# Patient Record
Sex: Male | Born: 1955 | Race: White | Hispanic: No | Marital: Married | Smoking: Never smoker
Health system: Southern US, Community
[De-identification: ages and names within clinical notes are randomized; demographics above are authoritative.]

## PROBLEM LIST (undated history)

## (undated) DIAGNOSIS — E119 Type 2 diabetes mellitus without complications: Secondary | ICD-10-CM

## (undated) DIAGNOSIS — E785 Hyperlipidemia, unspecified: Secondary | ICD-10-CM

## (undated) DIAGNOSIS — I10 Essential (primary) hypertension: Secondary | ICD-10-CM

## (undated) DIAGNOSIS — R55 Syncope and collapse: Secondary | ICD-10-CM

## (undated) HISTORY — DX: Syncope and collapse: R55

---

## 2011-10-05 ENCOUNTER — Ambulatory Visit: Payer: Self-pay

## 2011-10-05 DIAGNOSIS — E119 Type 2 diabetes mellitus without complications: Secondary | ICD-10-CM

## 2011-10-05 DIAGNOSIS — E7800 Pure hypercholesterolemia, unspecified: Secondary | ICD-10-CM

## 2011-10-05 DIAGNOSIS — I10 Essential (primary) hypertension: Secondary | ICD-10-CM

## 2012-05-18 ENCOUNTER — Ambulatory Visit: Payer: Self-pay | Admitting: Internal Medicine

## 2012-05-18 VITALS — BP 122/94 | HR 52 | Temp 98.2°F | Resp 16 | Ht 71.25 in | Wt 185.0 lb

## 2012-05-18 DIAGNOSIS — R7302 Impaired glucose tolerance (oral): Secondary | ICD-10-CM | POA: Insufficient documentation

## 2012-05-18 DIAGNOSIS — I10 Essential (primary) hypertension: Secondary | ICD-10-CM | POA: Insufficient documentation

## 2012-05-18 DIAGNOSIS — R7309 Other abnormal glucose: Secondary | ICD-10-CM

## 2012-05-18 DIAGNOSIS — E119 Type 2 diabetes mellitus without complications: Secondary | ICD-10-CM | POA: Insufficient documentation

## 2012-05-18 DIAGNOSIS — E785 Hyperlipidemia, unspecified: Secondary | ICD-10-CM

## 2012-05-18 DIAGNOSIS — E1159 Type 2 diabetes mellitus with other circulatory complications: Secondary | ICD-10-CM | POA: Insufficient documentation

## 2012-05-18 DIAGNOSIS — Z139 Encounter for screening, unspecified: Secondary | ICD-10-CM

## 2012-05-18 LAB — POCT CBC
Granulocyte percent: 64.9 %G (ref 37–80)
MID (cbc): 0.5 (ref 0–0.9)
MPV: 8.8 fL (ref 0–99.8)
POC Granulocyte: 6 (ref 2–6.9)
POC LYMPH PERCENT: 30 %L (ref 10–50)
POC MID %: 5.1 %M (ref 0–12)
Platelet Count, POC: 317 10*3/uL (ref 142–424)
RDW, POC: 13.2 %

## 2012-05-18 LAB — COMPREHENSIVE METABOLIC PANEL
ALT: 18 U/L (ref 0–53)
AST: 19 U/L (ref 0–37)
CO2: 31 mEq/L (ref 19–32)
Creat: 0.96 mg/dL (ref 0.50–1.35)
Total Bilirubin: 0.7 mg/dL (ref 0.3–1.2)

## 2012-05-18 LAB — TSH: TSH: 1.231 u[IU]/mL (ref 0.350–4.500)

## 2012-05-18 LAB — LIPID PANEL
HDL: 53 mg/dL (ref >39)
Total CHOL/HDL Ratio: 4.7 Ratio

## 2012-05-18 LAB — PSA: PSA: 1.3 ng/mL (ref <=4.00)

## 2012-05-18 LAB — GLUCOSE, POCT (MANUAL RESULT ENTRY): POC Glucose: 99 mg/dl (ref 70–99)

## 2012-05-18 LAB — POCT GLYCOSYLATED HEMOGLOBIN (HGB A1C): Hemoglobin A1C: 6.6

## 2012-05-18 MED ORDER — AMLODIPINE BESYLATE 10 MG PO TABS: 10.0000 mg | ORAL_TABLET | Freq: Every day | ORAL | Status: DC

## 2012-05-18 MED ORDER — LISINOPRIL-HYDROCHLOROTHIAZIDE 20-12.5 MG PO TABS: 1.0000 | ORAL_TABLET | Freq: Every day | ORAL | Status: DC

## 2012-05-18 NOTE — Progress Notes (Signed)
  Subjective:    Patient ID: Christopher Nolan, male    DOB: 10-Sep-1956, 56 y.o.   MRN: [id number]  HPI Feels great. Wants meds rf. Last visit before epic. Paper chart review reveals he is on 3 bp meds. We called pharmacy and he was given only lisinopril/hct and amlodipine and not metoprolol. He also was dx with glucose intolerance and hyperlipidemia. He is on no tx for these issues   Review of Systems No cpe in years    Objective:   Physical Exam  Constitutional: He is oriented to person, place, and time. He appears well-developed and well-nourished.  HENT:  Mouth/Throat: Oropharynx is clear and moist.  Eyes: EOM are normal.  Neck: Neck supple. No thyromegaly present.  Cardiovascular: Normal rate, regular rhythm and normal heart sounds.   Pulmonary/Chest: Effort normal and breath sounds normal.  Genitourinary: Rectum normal, prostate normal and penis normal.  Neurological: He is alert and oriented to person, place, and time.  Skin: Skin is warm and dry.  Psychiatric: He has a normal mood and affect.   labs  Results for orders placed in visit on 05/18/12  POCT CBC      Component Value Range   WBC 9.3  4.6 - 10.2 K/uL   Lymph, poc 2.8  0.6 - 3.4   POC LYMPH PERCENT 30.0  10 - 50 %L   MID (cbc) 0.5  0 - 0.9   POC MID % 5.1  0 - 12 %M   POC Granulocyte 6.0  2 - 6.9   Granulocyte percent 64.9  37 - 80 %G   RBC 5.24  4.69 - 6.13 M/uL   Hemoglobin 15.4  14.1 - 18.1 g/dL   HCT, POC 02.7  25.3 - 53.7 %   MCV 91.9  80 - 97 fL   MCH, POC 29.4  27 - 31.2 pg   MCHC 32.0  31.8 - 35.4 g/dL   RDW, POC 66.4     Platelet Count, POC 317  142 - 424 K/uL   MPV 8.8  0 - 99.8 fL  GLUCOSE, POCT (MANUAL RESULT ENTRY)      Component Value Range   POC Glucose 99  70 - 99 mg/dl  POCT GLYCOSYLATED HEMOGLOBIN (HGB A1C)      Component Value Range   Hemoglobin A1C 6.6          Assessment & Plan:  RF lisin/hct and amlodipine

## 2012-05-18 NOTE — Patient Instructions (Signed)
Place hyperlipidemia patient instructions here.  

## 2014-03-03 ENCOUNTER — Ambulatory Visit: Payer: Self-pay | Attending: Internal Medicine

## 2016-03-11 ENCOUNTER — Ambulatory Visit (HOSPITAL_COMMUNITY): Admission: AD | Admit: 2016-03-11 | Payer: Self-pay | Admitting: Cardiology

## 2016-03-11 ENCOUNTER — Emergency Department (HOSPITAL_COMMUNITY): Payer: Self-pay

## 2016-03-11 ENCOUNTER — Encounter (HOSPITAL_COMMUNITY): Payer: Self-pay | Admitting: Physical Medicine and Rehabilitation

## 2016-03-11 ENCOUNTER — Emergency Department (HOSPITAL_COMMUNITY)
Admission: EM | Admit: 2016-03-11 | Discharge: 2016-03-11 | Disposition: A | Discharge status: Home / Self Care | Payer: Self-pay | Source: Other Acute Inpatient Hospital | Attending: Emergency Medicine | Admitting: Emergency Medicine

## 2016-03-11 ENCOUNTER — Encounter (HOSPITAL_COMMUNITY): Admission: AD | Payer: Self-pay

## 2016-03-11 DIAGNOSIS — R55 Syncope and collapse: Secondary | ICD-10-CM | POA: Insufficient documentation

## 2016-03-11 DIAGNOSIS — E119 Type 2 diabetes mellitus without complications: Secondary | ICD-10-CM | POA: Insufficient documentation

## 2016-03-11 DIAGNOSIS — I10 Essential (primary) hypertension: Secondary | ICD-10-CM | POA: Insufficient documentation

## 2016-03-11 DIAGNOSIS — R110 Nausea: Secondary | ICD-10-CM | POA: Insufficient documentation

## 2016-03-11 HISTORY — DX: Essential (primary) hypertension: I10

## 2016-03-11 LAB — URINALYSIS, ROUTINE W REFLEX MICROSCOPIC
Bilirubin Urine: NEGATIVE
GLUCOSE, UA: NEGATIVE mg/dL
Hgb urine dipstick: NEGATIVE
Ketones, ur: NEGATIVE mg/dL
LEUKOCYTES UA: NEGATIVE
Nitrite: NEGATIVE
Protein, ur: 100 mg/dL — AB
Specific Gravity, Urine: 1.025 (ref 1.005–1.030)
pH: 6.5 (ref 5.0–8.0)

## 2016-03-11 LAB — URINE MICROSCOPIC-ADD ON

## 2016-03-11 LAB — BASIC METABOLIC PANEL
ANION GAP: 10 (ref 5–15)
BUN: 13 mg/dL (ref 6–20)
CALCIUM: 9.1 mg/dL (ref 8.9–10.3)
CO2: 29 mmol/L (ref 22–32)
Chloride: 100 mmol/L — ABNORMAL LOW (ref 101–111)
Creatinine, Ser: 1.14 mg/dL (ref 0.61–1.24)
GFR calc Af Amer: >60 mL/min (ref >60)
GFR calc non Af Amer: >60 mL/min (ref >60)
GLUCOSE: 231 mg/dL — AB (ref 65–99)
POTASSIUM: 3.8 mmol/L (ref 3.5–5.1)
Sodium: 139 mmol/L (ref 135–145)

## 2016-03-11 LAB — CBC
HEMATOCRIT: 49.2 % (ref 39.0–52.0)
HEMOGLOBIN: 17 g/dL (ref 13.0–17.0)
MCH: 29.8 pg (ref 26.0–34.0)
MCHC: 34.6 g/dL (ref 30.0–36.0)
MCV: 86.3 fL (ref 78.0–100.0)
Platelets: 288 10*3/uL (ref 150–400)
RBC: 5.7 MIL/uL (ref 4.22–5.81)
RDW: 12.2 % (ref 11.5–15.5)
WBC: 15.8 10*3/uL — ABNORMAL HIGH (ref 4.0–10.5)

## 2016-03-11 LAB — I-STAT TROPONIN, ED: Troponin i, poc: 0 ng/mL (ref 0.00–0.08)

## 2016-03-11 SURGERY — LEFT HEART CATH AND CORONARY ANGIOGRAPHY

## 2016-03-11 MED ORDER — LISINOPRIL 20 MG PO TABS
20.0000 mg | ORAL_TABLET | Freq: Once | ORAL | Status: AC
  Administered 2016-03-11: 20 mg via ORAL
  Filled 2016-03-11: qty 1

## 2016-03-11 MED ORDER — HYDROCHLOROTHIAZIDE 12.5 MG PO CAPS
12.5000 mg | ORAL_CAPSULE | Freq: Every day | ORAL | Status: DC
  Administered 2016-03-11: 12.5 mg via ORAL
  Filled 2016-03-11: qty 1

## 2016-03-11 NOTE — ED Provider Notes (Signed)
CSN: [id number]     Arrival date & time 03/11/16  0901 History   First MD Initiated Contact with Patient 03/11/16 [phone]     Chief Complaint  Patient presents with  . Loss of Consciousness     (Consider location/radiation/quality/duration/timing/severity/associated sxs/prior Treatment) Patient is a 60 y.o. male presenting with syncope. The history is provided by the patient.  Loss of Consciousness Episode history:  Single Most recent episode:  Today Duration:  30 seconds Timing:  Constant Progression:  Unchanged Chronicity:  New Witnessed: yes   Relieved by:  Nothing Worsened by:  Nothing tried Ineffective treatments:  None tried Associated symptoms: nausea (preceding)   Associated symptoms: no fever, no palpitations, no shortness of breath and no vomiting   Associated symptoms comment:  Became lightheaded immediately before event   Past Medical History  Diagnosis Date  . Hypertension    History reviewed. No pertinent past surgical history. No family history on file. Social History  Substance Use Topics  . Smoking status: Never Smoker   . Smokeless tobacco: Current User    Types: Chew  . Alcohol Use: No    Review of Systems  Constitutional: Negative for fever.  Respiratory: Negative for shortness of breath.   Cardiovascular: Positive for syncope. Negative for palpitations.  Gastrointestinal: Positive for nausea (preceding). Negative for vomiting.  All other systems reviewed and are negative.     Allergies  Review of patient's allergies indicates no known allergies.  Home Medications   Prior to Admission medications   Not on File   BP 145/110 mmHg  Pulse 91  Temp(Src) 97.8 F (36.6 C) (Oral)  Resp 18  Ht 6' (1.829 m)  Wt 177 lb (80.287 kg)  BMI 24.00 kg/m2  SpO2 96% Physical Exam  Constitutional: He is oriented to person, place, and time. He appears well-developed and well-nourished. No distress.  HENT:  Head: Normocephalic and atraumatic.  Eyes:  Conjunctivae are normal.  Neck: Neck supple. No tracheal deviation present.  Cardiovascular: Normal rate, regular rhythm and normal heart sounds.   No murmur heard. Pulmonary/Chest: Effort normal and breath sounds normal. No respiratory distress. He has no wheezes. He has no rales.  Abdominal: Soft. He exhibits no distension. There is no tenderness.  Neurological: He is alert and oriented to person, place, and time.  Skin: Skin is warm and dry.  Psychiatric: He has a normal mood and affect.  Vitals reviewed.   ED Course  Procedures (including critical care time) Labs Review Labs Reviewed  BASIC METABOLIC PANEL - Abnormal; Notable for the following:    Chloride 100 (*)    Glucose, Bld 231 (*)    All other components within normal limits  CBC - Abnormal; Notable for the following:    WBC 15.8 (*)    All other components within normal limits  URINALYSIS, ROUTINE W REFLEX MICROSCOPIC (NOT AT [facility]) - Abnormal; Notable for the following:    APPearance CLOUDY (*)    Protein, ur 100 (*)    All other components within normal limits  URINE MICROSCOPIC-ADD ON - Abnormal; Notable for the following:    Squamous Epithelial / LPF 0-5 (*)    Bacteria, UA FEW (*)    Casts HYALINE CASTS (*)    All other components within normal limits  Rosezena SensorI-STAT TROPOININ, ED    Imaging Review Dg Chest Port 1 View  03/11/2016  CLINICAL DATA:  Syncope EXAM: PORTABLE CHEST 1 VIEW COMPARISON:  None. FINDINGS: Normal heart size. Lungs clear. No pneumothorax.  No pleural effusion. IMPRESSION: No active disease. Electronically Signed   By: Jolaine Click M.D.   On: 03/11/2016 09:39   I have personally reviewed and evaluated these images and lab results as part of my medical decision-making.   EKG Interpretation   Date/Time:  Tuesday March 11 2016 09:08:36 EDT Ventricular Rate:  91 PR Interval:  171 QRS Duration: 84 QT Interval:  344 QTC Calculation: 423 R Axis:   -14 Text Interpretation:  Sinus rhythm Abnormal  R-wave progression, early  transition Otherwise normal ECG No previous tracing Confirmed by Zakye Baby MD,  Reuel Boom ([id number]) on 03/11/2016 9:25:11 AM      MDM   Final diagnoses:  Syncope and collapse  Diabetes mellitus without complication (HCC)    60 year old male presents after a syncopal episode where he was at work, felt nauseated briefly, walked outside and then became weak and slumped to the ground. He was unconscious for several seconds and then regained consciousness and returned to his baseline. No preceding palpitations or any shortness of breath, chest pain or other concerning symptoms. He was triaged with EMS as a code STEMI with questionable anterior elevation noted on field EKG in absence of chest pain which was canceled on arrival. EKG interpreted by me without ST or T wave changes concerning for myocardial ischemia. No delta wave, no prolonged QTc, no brugada to suggest arrhythmogenicity.  Patient with elevated blood sugar today suggesting undiagnosed diabetes which may be partially at fault. No significant hematologic or metabolic abnormalities to explain symptoms. No signs of infection except for nonspecific leukocytosis likely reactive. Low risk by SF rule. Plan to follow up with PCP as needed for further workup of diabetes and return precautions discussed for worsening or new concerning symptoms.   Lyndal Pulley, MD 03/11/16 [phone]

## 2016-03-11 NOTE — ED Notes (Signed)
Pt up to bathroom attempting to void.  

## 2016-03-11 NOTE — ED Notes (Signed)
[facility] of syncopal episode while at work. Bystanders states they saw him lower himself to ground, LOC for several seconds. Received 324 ASA. 18g RAC. Pt is alert and oriented x4 upon arrival to ED. Respirations unlabored.

## 2016-03-11 NOTE — ED Notes (Signed)
Gave pt ice water, per Aundra MilletMegan - Charity fundraiserN.

## 2016-03-11 NOTE — ED Notes (Signed)
Patient undressed, in gown, on monitor, continuous pulse oximetry and blood pressure cuff 

## 2016-03-11 NOTE — Discharge Instructions (Signed)
Diabetes and Exercise Exercising regularly is important. It is not just about losing weight. It has many health benefits, such as:  Improving your overall fitness, flexibility, and endurance.  Increasing your bone density.  Helping with weight control.  Decreasing your body fat.  Increasing your muscle strength.  Reducing stress and tension.  Improving your overall health. People with diabetes who exercise gain additional benefits because exercise:  Reduces appetite.  Improves the body's use of blood sugar (glucose).  Helps lower or control blood glucose.  Decreases blood pressure.  Helps control blood lipids (such as cholesterol and triglycerides).  Improves the body's use of the hormone insulin by:  Increasing the body's insulin sensitivity.  Reducing the body's insulin needs.  Decreases the risk for heart disease because exercising:  Lowers cholesterol and triglycerides levels.  Increases the levels of good cholesterol (such as high-density lipoproteins [HDL]) in the body.  Lowers blood glucose levels. YOUR ACTIVITY PLAN  Choose an activity that you enjoy, and set realistic goals. To exercise safely, you should begin practicing any new physical activity slowly, and gradually increase the intensity of the exercise over time. Your health care provider or diabetes educator can help create an activity plan that works for you. General recommendations include:  Encouraging children to engage in at least 60 minutes of physical activity each day.  Stretching and performing strength training exercises, such as yoga or weight lifting, at least 2 times per week.  Performing a total of at least 150 minutes of moderate-intensity exercise each week, such as brisk walking or water aerobics.  Exercising at least 3 days per week, making sure you allow no more than 2 consecutive days to pass without exercising.  Avoiding long periods of inactivity (90 minutes or more). When you  have to spend an extended period of time sitting down, take frequent breaks to walk or stretch. RECOMMENDATIONS FOR EXERCISING WITH TYPE 1 OR TYPE 2 DIABETES   Check your blood glucose before exercising. If blood glucose levels are greater than 240 mg/dL, check for urine ketones. Do not exercise if ketones are present.  Avoid injecting insulin into areas of the body that are going to be exercised. For example, avoid injecting insulin into:  The arms when playing tennis.  The legs when jogging.  Keep a record of:  Food intake before and after you exercise.  Expected peak times of insulin action.  Blood glucose levels before and after you exercise.  The type and amount of exercise you have done.  Review your records with your health care provider. Your health care provider will help you to develop guidelines for adjusting food intake and insulin amounts before and after exercising.  If you take insulin or oral hypoglycemic agents, watch for signs and symptoms of hypoglycemia. They include:  Dizziness.  Shaking.  Sweating.  Chills.  Confusion.  Drink plenty of water while you exercise to prevent dehydration or heat stroke. Body water is lost during exercise and must be replaced.  Talk to your health care provider before starting an exercise program to make sure it is safe for you. Remember, almost any type of activity is better than none.   This information is not intended to replace advice given to you by your health care provider. Make sure you discuss any questions you have with your health care provider.   Document Released: 12/13/2003 Document Revised: 02/06/2015 Document Reviewed: 03/01/2013 Elsevier Interactive Patient Education 2016 Else[employer] Diabetes Mellitus and Food It is important for   you to manage your blood sugar (glucose) level. Your blood glucose level can be greatly affected by what you eat. Eating healthier foods in the appropriate amounts throughout  the day at about the same time each day will help you control your blood glucose level. It can also help slow or prevent worsening of your diabetes mellitus. Healthy eating may even help you improve the level of your blood pressure and reach or maintain a healthy weight.  General recommendations for healthful eating and cooking habits include:  Eating meals and snacks regularly. Avoid going long periods of time without eating to lose weight.  Eating a diet that consists mainly of plant-based foods, such as fruits, vegetables, nuts, legumes, and whole grains.  Using low-heat cooking methods, such as baking, instead of high-heat cooking methods, such as deep frying. Work with your dietitian to make sure you understand how to use the Nutrition Facts information on food labels. HOW CAN FOOD AFFECT ME? Carbohydrates Carbohydrates affect your blood glucose level more than any other type of food. Your dietitian will help you determine how many carbohydrates to eat at each meal and teach you how to count carbohydrates. Counting carbohydrates is important to keep your blood glucose at a healthy level, especially if you are using insulin or taking certain medicines for diabetes mellitus. Alcohol Alcohol can cause sudden decreases in blood glucose (hypoglycemia), especially if you use insulin or take certain medicines for diabetes mellitus. Hypoglycemia can be a life-threatening condition. Symptoms of hypoglycemia (sleepiness, dizziness, and disorientation) are similar to symptoms of having too much alcohol.  If your health care provider has given you approval to drink alcohol, do so in moderation and use the following guidelines:  Women should not have more than one drink per day, and men should not have more than two drinks per day. One drink is equal to:  12 oz of beer.  5 oz of wine.  1 oz of hard liquor.  Do not drink on an empty stomach.  Keep yourself hydrated. Have water, diet soda, or  unsweetened iced tea.  Regular soda, juice, and other mixers might contain a lot of carbohydrates and should be counted. WHAT FOODS ARE NOT RECOMMENDED? As you make food choices, it is important to remember that all foods are not the same. Some foods have fewer nutrients per serving than other foods, even though they might have the same number of calories or carbohydrates. It is difficult to get your body what it needs when you eat foods with fewer nutrients. Examples of foods that you should avoid that are high in calories and carbohydrates but low in nutrients include:  Trans fats (most processed foods list trans fats on the Nutrition Facts label).  Regular soda.  Juice.  Candy.  Sweets, such as cake, pie, doughnuts, and cookies.  Fried foods. WHAT FOODS CAN I EAT? Eat nutrient-rich foods, which will nourish your body and keep you healthy. The food you should eat also will depend on several factors, including:  The calories you need.  The medicines you take.  Your weight.  Your blood glucose level.  Your blood pressure level.  Your cholesterol level. You should eat a variety of foods, including:  Protein.  Lean cuts of meat.  Proteins low in saturated fats, such as fish, egg whites, and beans. Avoid processed meats.  Fruits and vegetables.  Fruits and vegetables that may help control blood glucose levels, such as apples, mangoes, and yams.  Dairy products.  Choose fat-free   or low-fat dairy products, such as milk, yogurt, and cheese.  Grains, bread, pasta, and rice.  Choose whole grain products, such as multigrain bread, whole oats, and brown rice. These foods may help control blood pressure.  Fats.  Foods containing healthful fats, such as nuts, avocado, olive oil, canola oil, and fish. DOES EVERYONE WITH DIABETES MELLITUS HAVE THE SAME MEAL PLAN? Because every person with diabetes mellitus is different, there is not one meal plan that works for everyone. It  is very important that you meet with a dietitian who will help you create a meal plan that is just right for you.   This information is not intended to replace advice given to you by your health care provider. Make sure you discuss any questions you have with your health care provider.   Document Released: 06/19/2005 Document Revised: 10/13/2014 Document Reviewed: 08/19/2013 Elsevier Interactive Patient Education 2016 Elsevier Inc.  

## 2016-03-12 ENCOUNTER — Encounter: Payer: Self-pay | Admitting: Internal Medicine

## 2016-03-12 ENCOUNTER — Ambulatory Visit: Payer: Self-pay | Attending: Internal Medicine | Admitting: Internal Medicine

## 2016-03-12 VITALS — BP 163/94 | HR 66 | Temp 97.8°F | Resp 18 | Ht 71.5 in | Wt 182.0 lb

## 2016-03-12 DIAGNOSIS — I10 Essential (primary) hypertension: Secondary | ICD-10-CM

## 2016-03-12 DIAGNOSIS — R55 Syncope and collapse: Secondary | ICD-10-CM

## 2016-03-12 DIAGNOSIS — R739 Hyperglycemia, unspecified: Secondary | ICD-10-CM

## 2016-03-12 NOTE — Progress Notes (Signed)
Patient is here for HFU for Syncope  Patient denies pain at this time and patient any syncope episodes.

## 2016-03-12 NOTE — Patient Instructions (Signed)
Stay well hydrated. If symptoms recur please call us

## 2016-03-12 NOTE — Progress Notes (Signed)
Passed out at work yesterday.  At work yesterday morning- felt nauseated, walked outside to vomit. Prior to vomiting he felt light headed than he vomited and los t consciousness. Brief episode. No injury with fall. No previous similar episode. Went to ED- I have reviewed the notes  Note blood sugar > 200  It's worth noting that he does take amlodipine and lisinopril/hctz  Mother with hx of DM (deceased)  Past Medical History  Diagnosis Date  . Hypertension     Social History   Social History  . Marital Status: Married    Spouse Name: N/A  . Number of Children: N/A  . Years of Education: N/A   Occupational History  . Not on file.   Social History Main Topics  . Smoking status: Never Smoker   . Smokeless tobacco: Current User    Types: Chew  . Alcohol Use: No  . Drug Use: No  . Sexual Activity: Not on file   Other Topics Concern  . Not on file   Social History Narrative   ** Merged History Encounter **        History reviewed. No pertinent past surgical history.  History reviewed. No pertinent family history.  No Known Allergies  Current Outpatient Prescriptions on File Prior to Visit  Medication Sig Dispense Refill  . amLODipine (NORVASC) 10 MG tablet Take 1 tablet (10 mg total) by mouth daily. 90 tablet 3  . lisinopril-hydrochlorothiazide (PRINZIDE,ZESTORETIC) 20-12.5 MG per tablet Take 1 tablet by mouth daily. 90 tablet 3   No current facility-administered medications on file prior to visit.     patient denies chest pain, shortness of breath, orthopnea. Denies lower extremity edema, abdominal pain, change in appetite, change in bowel movements. Patient denies rashes, musculoskeletal complaints. No other specific complaints in a complete review of systems.   BP 163/94 mmHg  Pulse 66  Temp(Src) 97.8 F (36.6 C) (Oral)  Resp 18  Ht 5' 11.5" (1.816 m)  Wt 182 lb (82.555 kg)  BMI 25.03 kg/m2  SpO2 95%  well-developed well-nourished male in no acute  distress. HEENT exam atraumatic, normocephalic poor dentition.  , neck supple without jugular venous distention. Chest clear to auscultation cardiac exam S1-S2 are regular. Abdominal exam overweight with bowel sounds, soft and nontender. Extremities no edema. Neurologic exam is alert with a normal gait.   Syncope- almost certainly vasovagal- exacerbated by antihypertensives. Discussed. He understands the need to sit/lie if he feels light headed. I do not think this wil recur  i rechecked BP- 155/90 He will monitor at home- i will not add additional bp meds at this time.  Hyperglycemia- check cbg and a1c. - pre DM- he understands

## 2020-12-31 ENCOUNTER — Other Ambulatory Visit: Payer: Self-pay

## 2020-12-31 ENCOUNTER — Emergency Department (HOSPITAL_COMMUNITY)
Admission: EM | Admit: 2020-12-31 | Discharge: 2020-12-31 | Disposition: A | Discharge status: Home / Self Care | Payer: BLUE CROSS/BLUE SHIELD | Attending: Emergency Medicine | Admitting: Emergency Medicine

## 2020-12-31 DIAGNOSIS — R42 Dizziness and giddiness: Secondary | ICD-10-CM | POA: Diagnosis not present

## 2020-12-31 DIAGNOSIS — Z79899 Other long term (current) drug therapy: Secondary | ICD-10-CM | POA: Insufficient documentation

## 2020-12-31 DIAGNOSIS — R55 Syncope and collapse: Secondary | ICD-10-CM | POA: Insufficient documentation

## 2020-12-31 DIAGNOSIS — I10 Essential (primary) hypertension: Secondary | ICD-10-CM | POA: Insufficient documentation

## 2020-12-31 LAB — BASIC METABOLIC PANEL
Anion gap: 10 (ref 5–15)
BUN: 18 mg/dL (ref 8–23)
CO2: 28 mmol/L (ref 22–32)
Calcium: 9 mg/dL (ref 8.9–10.3)
Chloride: 98 mmol/L (ref 98–111)
Creatinine, Ser: 1.25 mg/dL — ABNORMAL HIGH (ref 0.61–1.24)
GFR, Estimated: >60 mL/min (ref >60)
Glucose, Bld: 184 mg/dL — ABNORMAL HIGH (ref 70–99)
Potassium: 3.4 mmol/L — ABNORMAL LOW (ref 3.5–5.1)
Sodium: 136 mmol/L (ref 135–145)

## 2020-12-31 LAB — CBC WITH DIFFERENTIAL/PLATELET
Abs Immature Granulocytes: 0.04 10*3/uL (ref 0.00–0.07)
Basophils Absolute: 0.1 10*3/uL (ref 0.0–0.1)
Basophils Relative: 0 %
Eosinophils Absolute: 0 10*3/uL (ref 0.0–0.5)
Eosinophils Relative: 0 %
HCT: 41.1 % (ref 39.0–52.0)
Hemoglobin: 14.2 g/dL (ref 13.0–17.0)
Immature Granulocytes: 0 %
Lymphocytes Relative: 9 %
Lymphs Abs: 1.1 10*3/uL (ref 0.7–4.0)
MCH: 30.1 pg (ref 26.0–34.0)
MCHC: 34.5 g/dL (ref 30.0–36.0)
MCV: 87.1 fL (ref 80.0–100.0)
Monocytes Absolute: 0.4 10*3/uL (ref 0.1–1.0)
Monocytes Relative: 4 %
Neutro Abs: 10.1 10*3/uL — ABNORMAL HIGH (ref 1.7–7.7)
Neutrophils Relative %: 87 %
Platelets: 285 10*3/uL (ref 150–400)
RBC: 4.72 MIL/uL (ref 4.22–5.81)
RDW: 12.6 % (ref 11.5–15.5)
WBC: 11.7 10*3/uL — ABNORMAL HIGH (ref 4.0–10.5)
nRBC: 0 % (ref 0.0–0.2)

## 2020-12-31 NOTE — ED Triage Notes (Addendum)
Pt came in via EMS. Pt had an episode of severe dizziness at work, and became diaphoretic.  No evidence of any injuries.   126/92 BP 82 HR 94% RA 188 CBG

## 2020-12-31 NOTE — Discharge Instructions (Signed)
Eat and drink well for the next couple of days.  Follow up with your family doc in the office.

## 2020-12-31 NOTE — ED Provider Notes (Signed)
[facility] EMERGENCY DEPARTMENT Provider Note   CSN: [id number] Arrival date & time: 12/31/20  0867     History Chief Complaint  Patient presents with  . Dizziness    Christopher Nolan is a 65 y.o. male.  65 yo M with a chief complaints of dizziness.  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  He walked outside to get some fresh air and had some worsening when he came back and his boss saw him sweaty and got him to lay on the ground.  Symptoms lasted for a couple minutes and then resolved.  Patient now feels back to baseline.  He denied any headache neck pain chest pain or shortness of breath with this.  Denied any symptoms previous.  Has been eating and drinking normally.  No nausea vomiting or diarrhea.  No dark stool or blood in his stool.  No recent medication or diet change.  The history is provided by the patient.  Dizziness Associated symptoms: no chest pain, no diarrhea, no headaches, no palpitations, no shortness of breath and no vomiting   Illness Severity:  Moderate Onset quality:  Sudden Duration:  2 minutes Timing:  Rare Progression:  Resolved Chronicity:  New Associated symptoms: no abdominal pain, no chest pain, no congestion, no diarrhea, no fever, no headaches, no myalgias, no rash, no shortness of breath and no vomiting        Past Medical History:  Diagnosis Date  . Hypertension     Patient Active Problem List   Diagnosis Date Noted  . HTN (hypertension) 05/18/2012  . Glucose intolerance (impaired glucose tolerance) 05/18/2012    No past surgical history on file.     No family history on file.  Social History   Tobacco Use  . Smoking status: Never Smoker  . Smokeless tobacco: Current User    Types: Chew  Substance Use Topics  . Alcohol use: No  . Drug use: No    Home Medications Prior to Admission medications   Medication Sig Start Date End Date Taking? Authorizing Provider  amLODipine (NORVASC) 10 MG  tablet Take 1 tablet (10 mg total) by mouth daily. 05/18/12   Jonita Albee, MD  lisinopril-hydrochlorothiazide (PRINZIDE,ZESTORETIC) 20-12.5 MG per tablet Take 1 tablet by mouth daily. 05/18/12   Jonita Albee, MD    Allergies    Patient has no known allergies.  Review of Systems   Review of Systems  Constitutional: Negative for chills and fever.  HENT: Negative for congestion and facial swelling.   Eyes: Negative for discharge and visual disturbance.  Respiratory: Negative for shortness of breath.   Cardiovascular: Negative for chest pain and palpitations.  Gastrointestinal: Negative for abdominal pain, diarrhea and vomiting.  Musculoskeletal: Negative for arthralgias and myalgias.  Skin: Negative for color change and rash.  Neurological: Positive for dizziness and syncope. Negative for tremors and headaches.  Psychiatric/Behavioral: Negative for confusion and dysphoric mood.    Physical Exam Updated Vital Signs BP (!) 160/88 (BP Location: Right Arm)   Pulse 79   Temp 98 F (36.7 C) (Oral)   Resp 16   SpO2 96%   Physical Exam Vitals and nursing note reviewed.  Constitutional:      Appearance: He is well-developed.  HENT:     Head: Normocephalic and atraumatic.  Eyes:     Pupils: Pupils are equal, round, and reactive to light.  Neck:     Vascular: No JVD.  Cardiovascular:  Rate and Rhythm: Normal rate and regular rhythm.     Heart sounds: No murmur heard. No friction rub. No gallop.   Pulmonary:     Effort: No respiratory distress.     Breath sounds: No wheezing.  Abdominal:     General: There is no distension.     Tenderness: There is no abdominal tenderness. There is no guarding or rebound.  Musculoskeletal:        General: Normal range of motion.     Cervical back: Normal range of motion and neck supple.  Skin:    Coloration: Skin is not pale.     Findings: No rash.  Neurological:     Mental Status: He is alert and oriented to person, place, and time.   Psychiatric:        Behavior: Behavior normal.     ED Results / Procedures / Treatments   Labs (all labs ordered are listed, but only abnormal results are displayed) Labs Reviewed  CBC WITH DIFFERENTIAL/PLATELET - Abnormal; Notable for the following components:      Result Value   WBC 11.7 (*)    Neutro Abs 10.1 (*)    All other components within normal limits  BASIC METABOLIC PANEL - Abnormal; Notable for the following components:   Potassium 3.4 (*)    Glucose, Bld 184 (*)    Creatinine, Ser 1.25 (*)    All other components within normal limits    EKG EKG Interpretation  Date/Time:  Monday December 31 2020 09:12:58 EDT Ventricular Rate:  79 PR Interval:    QRS Duration: 89 QT Interval:  383 QTC Calculation: 439 R Axis:   27 Text Interpretation: Sinus rhythm Abnormal R-wave progression, early transition no wpw, prolonged qt or brugada No old tracing to compare Confirmed by Melene Plan [phone]) on 12/31/2020 9:24:04 AM   Radiology No results found.  Procedures Procedures   Medications Ordered in ED Medications - No data to display  ED Course  I have reviewed the triage vital signs and the nursing notes.  Pertinent labs & imaging results that were available during my care of the patient were reviewed by me and considered in my medical decision making (see chart for details).    MDM Rules/Calculators/A&P                          65 yo M with a chief complaints of lightheadedness.  This occurred spontaneously while he was at work and has resolved spontaneously.  Vasovagal by history.  Asymptomatic currently.  Will obtain blood work EKG.  EKG without concerning finding.  CBC without acute anemia no significant electrolyte abnormality.  Will discharge patient home.  PCP follow-up.  11:38 AM:  I have discussed the diagnosis/risks/treatment options with the patient and believe the pt to be eligible for discharge home to follow-up with PCP. We also discussed returning to  the ED immediately if new or worsening sx occur. We discussed the sx which are most concerning (e.g., sudden worsening pain, fever, inability to tolerate by mouth) that necessitate immediate return. Medications administered to the patient during their visit and any new prsescriptions provided to the patient are listed below.  Medications given during this visit Medications - No data to display   The patient appears reasonably screen and/or stabilized for discharge and I doubt any other medical condition or other [facility] requiring further screening, evaluation, or treatment in the ED at this time prior to discharge.  Final Clinical Impression(s) / ED Diagnoses Final diagnoses:  Near syncope    Rx / DC Orders ED Discharge Orders    None       Melene Plan, DO 12/31/20 1139

## 2021-02-21 ENCOUNTER — Emergency Department (HOSPITAL_COMMUNITY)
Admission: EM | Admit: 2021-02-21 | Discharge: 2021-02-21 | Disposition: A | Discharge status: Home / Self Care | Payer: BLUE CROSS/BLUE SHIELD | Attending: Physician Assistant | Admitting: Physician Assistant

## 2021-02-21 DIAGNOSIS — R000 Tachycardia, unspecified: Secondary | ICD-10-CM | POA: Insufficient documentation

## 2021-02-21 DIAGNOSIS — R509 Fever, unspecified: Secondary | ICD-10-CM | POA: Insufficient documentation

## 2021-02-21 DIAGNOSIS — Z20822 Contact with and (suspected) exposure to covid-19: Secondary | ICD-10-CM | POA: Diagnosis not present

## 2021-02-21 DIAGNOSIS — I10 Essential (primary) hypertension: Secondary | ICD-10-CM | POA: Insufficient documentation

## 2021-02-21 DIAGNOSIS — Z79899 Other long term (current) drug therapy: Secondary | ICD-10-CM | POA: Diagnosis not present

## 2021-02-21 LAB — RESP PANEL BY RT-PCR (FLU A&B, COVID) ARPGX2
Influenza A by PCR: NEGATIVE
Influenza B by PCR: NEGATIVE
SARS Coronavirus 2 by RT PCR: NEGATIVE

## 2021-02-21 MED ORDER — ACETAMINOPHEN 500 MG PO TABS
1000.0000 mg | ORAL_TABLET | Freq: Once | ORAL | Status: AC
  Administered 2021-02-21: 1000 mg via ORAL

## 2021-02-21 NOTE — ED Notes (Signed)
Patient Alert and oriented to baseline. Stable and ambulatory to baseline. Patient verbalized understanding of the discharge instructions.  Patient belongings were taken by the patient.   

## 2021-02-21 NOTE — ED Provider Notes (Signed)
[facility] EMERGENCY DEPARTMENT Provider Note   CSN: [id number] Arrival date & time: 02/21/21  1442     History No chief complaint on file.   Christopher Nolan is a 65 y.o. male.  HPI 65 year old male with history of hypertension [place] to the ER with complaints of high blood pressure reading at work. Patient was at work and an EMT had come by and was checking his blood pressure.  Reportedly his blood pressure read 210/80.  He has a history of hypertension, reports compliance with his medications.  Blood pressure here is 154/96.  He has no chest pain, shortness of breath, vision changes, headache.  Overall feeling well.  He was noted to be febrile here in the ER with a temperature one 101.1, mildly tachycardic.  He states that he had "chills", felt very cold this morning to the point that he had to go outside to get warm.  He has no other symptoms, no cough, nasal congestion, abdominal pain, nausea or vomiting.     Past Medical History:  Diagnosis Date  . Hypertension     Patient Active Problem List   Diagnosis Date Noted  . HTN (hypertension) 05/18/2012  . Glucose intolerance (impaired glucose tolerance) 05/18/2012    No past surgical history on file.     No family history on file.  Social History   Tobacco Use  . Smoking status: Never Smoker  . Smokeless tobacco: Current User    Types: Chew  Substance Use Topics  . Alcohol use: No  . Drug use: No    Home Medications Prior to Admission medications   Medication Sig Start Date End Date Taking? Authorizing Provider  amLODipine (NORVASC) 10 MG tablet Take 1 tablet (10 mg total) by mouth daily. 05/18/12   Jonita Albee, MD  lisinopril-hydrochlorothiazide (PRINZIDE,ZESTORETIC) 20-12.5 MG per tablet Take 1 tablet by mouth daily. 05/18/12   Jonita Albee, MD    Allergies    Patient has no known allergies.  Review of Systems   Review of Systems  Constitutional: Positive for chills.  Respiratory:  Negative for shortness of breath.   Cardiovascular: Negative for chest pain.  Neurological: Negative for dizziness, weakness and headaches.    Physical Exam Updated Vital Signs BP (!) 154/96 (BP Location: Left Arm)   Pulse (!) 107   Temp (!) 101.1 F (38.4 C) (Oral)   Resp 16   SpO2 96%   Physical Exam Vitals and nursing note reviewed.  Constitutional:      General: He is not in acute distress.    Appearance: He is well-developed. He is not ill-appearing, toxic-appearing or diaphoretic.  HENT:     Head: Normocephalic and atraumatic.  Eyes:     Conjunctiva/sclera: Conjunctivae normal.  Cardiovascular:     Rate and Rhythm: Normal rate and regular rhythm.     Heart sounds: No murmur heard.   Pulmonary:     Effort: Pulmonary effort is normal. No respiratory distress.     Breath sounds: Normal breath sounds.  Abdominal:     Palpations: Abdomen is soft.     Tenderness: There is no abdominal tenderness.  Musculoskeletal:        General: Normal range of motion.     Cervical back: Neck supple.  Skin:    General: Skin is warm and dry.  Neurological:     General: No focal deficit present.     Mental Status: He is alert and oriented to person, place, and time.  Sensory: No sensory deficit.     Motor: No weakness.     ED Results / Procedures / Treatments   Labs (all labs ordered are listed, but only abnormal results are displayed) Labs Reviewed  RESP PANEL BY RT-PCR (FLU A&B, COVID) [id number]    EKG None  Radiology No results found.  Procedures Procedures   Medications Ordered in ED Medications  acetaminophen (TYLENOL) tablet 1,000 mg (1,000 mg Oral Given 02/21/21 1623)    ED Course  I have reviewed the triage vital signs and the nursing notes.  Pertinent labs & imaging results that were available during my care of the patient were reviewed by me and considered in my medical decision making (see chart for details).    MDM Rules/Calculators/A&P                           65 year old male who presents with complaint of high blood pressure reading.  Blood pressure 154/96 here.  Febrile with a temperature of 101.1.  Given Tylenol here.  Mildly tachycardic.  Overall feeling well.  No complaints, no chest pain, shortness of breath, headache.  No signs of hypertensive urgency/emergency.  Patient was swabbed for COVID and the flu here.  Encouraged him to follow-up on the results via MyChart.  I do not think he needs additional blood work, or further evaluation.  Encouraged PCP follow-up for blood pressure recheck and possible readjustment of his medicines.  We discussed return precautions.  He voiced understanding and is agreeable.  Stable for discharge. Final Clinical Impression(s) / ED Diagnoses Final diagnoses:  Hypertension, unspecified type  Fever, unspecified fever cause    Rx / DC Orders ED Discharge Orders    None       Mare Ferrari, PA-C 02/21/21 1629    Melene Plan, DO 02/24/21 1506

## 2021-02-21 NOTE — ED Triage Notes (Signed)
Pt here with reports of 210/180 BP. Pt with no complaints. Febrile in triage.

## 2021-02-21 NOTE — Discharge Instructions (Signed)
Your blood pressure reading today here in the ER was overall reassuring.  You were tested for COVID and the flu, please follow-up on these results via MyChart.  Please follow-up with your primary care doctor to have your blood pressure rechecked.  Return to the ER for any new or worsening symptoms.

## 2021-02-21 NOTE — ED Provider Notes (Addendum)
Emergency Medicine Provider Triage Evaluation Note  Christopher Nolan , a 65 y.o. male  was evaluated in triage.  Pt complains of hypertension.  Patient was at work and an EMT had come by and was checking pupils blood pressure..  Reportedly his blood pressure read 210/180.  He has a history of hypertension, reports compliance with his medications.  Blood pressure here is 154/96.  He has no chest pain, shortness of breath, vision changes, headache.  Overall feeling well.  He was noted to be febrile here in the ER with a temperature one 1.1, mildly tachycardic.  He states that he had "chills", felt very cold this morning to the point that he had to go outside to get warm.  He has no other symptoms, no cough, nasal congestion, abdominal pain, nausea or vomiting.  Review of Systems  Positive: As above Negative: As above  Physical Exam  BP (!) 154/96 (BP Location: Left Arm)   Pulse (!) 107   Temp (!) 101.1 F (38.4 C) (Oral)   Resp 16   SpO2 96%  Gen:   Awake, no distress   Resp:  Normal effort  MSK:   Moves extremities without difficulty  Other:  Well-appearing, speaking full sentences without increased work of breathing  Medical Decision Making  Medically screening exam initiated at 4:11 PM.  Appropriate orders placed.  Christopher Nolan was informed that the remainder of the evaluation will be completed by another provider, this initial triage assessment does not replace that evaluation, and the importance of remaining in the ED until their evaluation is complete.     Mare Ferrari, PA-C 02/21/21 1613    Mare Ferrari, PA-C 02/21/21 1623    Melene Plan, DO 02/21/21 1625

## 2022-03-18 ENCOUNTER — Emergency Department (HOSPITAL_COMMUNITY)
Admission: EM | Admit: 2022-03-18 | Discharge: 2022-03-18 | Disposition: A | Discharge status: Home / Self Care | Payer: Medicare Other | Attending: Emergency Medicine | Admitting: Emergency Medicine

## 2022-03-18 ENCOUNTER — Emergency Department (HOSPITAL_COMMUNITY): Payer: Medicare Other

## 2022-03-18 ENCOUNTER — Encounter (HOSPITAL_COMMUNITY): Payer: Self-pay

## 2022-03-18 DIAGNOSIS — I10 Essential (primary) hypertension: Secondary | ICD-10-CM | POA: Diagnosis present

## 2022-03-18 DIAGNOSIS — Z7982 Long term (current) use of aspirin: Secondary | ICD-10-CM | POA: Diagnosis not present

## 2022-03-18 DIAGNOSIS — R519 Headache, unspecified: Secondary | ICD-10-CM | POA: Diagnosis not present

## 2022-03-18 DIAGNOSIS — E119 Type 2 diabetes mellitus without complications: Secondary | ICD-10-CM | POA: Diagnosis not present

## 2022-03-18 DIAGNOSIS — Z7984 Long term (current) use of oral hypoglycemic drugs: Secondary | ICD-10-CM | POA: Diagnosis not present

## 2022-03-18 DIAGNOSIS — Z79899 Other long term (current) drug therapy: Secondary | ICD-10-CM | POA: Diagnosis not present

## 2022-03-18 HISTORY — DX: Type 2 diabetes mellitus without complications: E11.9

## 2022-03-18 HISTORY — DX: Hyperlipidemia, unspecified: E78.5

## 2022-03-18 LAB — URINALYSIS, ROUTINE W REFLEX MICROSCOPIC
Bilirubin Urine: NEGATIVE
Glucose, UA: NEGATIVE mg/dL
Hgb urine dipstick: NEGATIVE
Ketones, ur: NEGATIVE mg/dL
Leukocytes,Ua: NEGATIVE
Nitrite: NEGATIVE
Protein, ur: NEGATIVE mg/dL
Specific Gravity, Urine: 1.004 — ABNORMAL LOW (ref 1.005–1.030)
pH: 8 (ref 5.0–8.0)

## 2022-03-18 LAB — COMPREHENSIVE METABOLIC PANEL
ALT: 19 U/L (ref 0–44)
AST: 22 U/L (ref 15–41)
Albumin: 4.2 g/dL (ref 3.5–5.0)
Alkaline Phosphatase: 65 U/L (ref 38–126)
Anion gap: 8 (ref 5–15)
BUN: 11 mg/dL (ref 8–23)
CO2: 30 mmol/L (ref 22–32)
Calcium: 8.9 mg/dL (ref 8.9–10.3)
Chloride: 101 mmol/L (ref 98–111)
Creatinine, Ser: 1.1 mg/dL (ref 0.61–1.24)
GFR, Estimated: >60 mL/min (ref >60)
Glucose, Bld: 179 mg/dL — ABNORMAL HIGH (ref 70–99)
Potassium: 3.5 mmol/L (ref 3.5–5.1)
Sodium: 139 mmol/L (ref 135–145)
Total Bilirubin: 0.7 mg/dL (ref 0.3–1.2)
Total Protein: 7.1 g/dL (ref 6.5–8.1)

## 2022-03-18 LAB — CBC WITH DIFFERENTIAL/PLATELET
Abs Immature Granulocytes: 0.01 10*3/uL (ref 0.00–0.07)
Basophils Absolute: 0 10*3/uL (ref 0.0–0.1)
Basophils Relative: 0 %
Eosinophils Absolute: 0.1 10*3/uL (ref 0.0–0.5)
Eosinophils Relative: 1 %
HCT: 41.9 % (ref 39.0–52.0)
Hemoglobin: 14.4 g/dL (ref 13.0–17.0)
Immature Granulocytes: 0 %
Lymphocytes Relative: 21 %
Lymphs Abs: 1.4 10*3/uL (ref 0.7–4.0)
MCH: 29.9 pg (ref 26.0–34.0)
MCHC: 34.4 g/dL (ref 30.0–36.0)
MCV: 87.1 fL (ref 80.0–100.0)
Monocytes Absolute: 0.3 10*3/uL (ref 0.1–1.0)
Monocytes Relative: 5 %
Neutro Abs: 4.8 10*3/uL (ref 1.7–7.7)
Neutrophils Relative %: 73 %
Platelets: 268 10*3/uL (ref 150–400)
RBC: 4.81 MIL/uL (ref 4.22–5.81)
RDW: 12 % (ref 11.5–15.5)
WBC: 6.7 10*3/uL (ref 4.0–10.5)
nRBC: 0 % (ref 0.0–0.2)

## 2022-03-18 LAB — MAGNESIUM: Magnesium: 2.3 mg/dL (ref 1.7–2.4)

## 2022-03-18 MED ORDER — AMLODIPINE BESYLATE 10 MG PO TABS: 10.0000 mg | ORAL_TABLET | Freq: Every day | ORAL | 2 refills | Status: AC

## 2022-03-18 MED ORDER — TELMISARTAN 40 MG PO TABS: 40.0000 mg | ORAL_TABLET | Freq: Every day | ORAL | 2 refills | Status: DC

## 2022-03-18 MED ORDER — ISOSORBIDE MONONITRATE ER 30 MG PO TB24
30.0000 mg | ORAL_TABLET | Freq: Once | ORAL | Status: AC
Start: 2022-03-18 — End: 2022-03-18
  Administered 2022-03-18: 30 mg via ORAL
  Filled 2022-03-18: qty 1

## 2022-03-18 MED ORDER — NITROGLYCERIN 2 % TD OINT
1.0000 [in_us] | TOPICAL_OINTMENT | Freq: Once | TRANSDERMAL | Status: AC
  Administered 2022-03-18: 1 [in_us] via TOPICAL
  Filled 2022-03-18: qty 1

## 2022-03-18 MED ORDER — AMLODIPINE BESYLATE 5 MG PO TABS
10.0000 mg | ORAL_TABLET | Freq: Once | ORAL | Status: AC
  Administered 2022-03-18: 10 mg via ORAL
  Filled 2022-03-18: qty 2

## 2022-03-18 NOTE — Discharge Instructions (Signed)
Prescriptions for your blood pressure medications were sent to your pharmacy.  Take these as prescribed.  Continue to check your blood pressure at home and discuss need for other medications or dosage changes with your primary care doctor.  Return to the emergency department at any time if you experience any new symptoms of concern

## 2022-03-18 NOTE — ED Provider Triage Note (Signed)
Emergency Medicine Provider Triage Evaluation Note  Mekai Wilkinson , a 66 y.o. male  was evaluated in triage.  Pt complains of elevated blood pressure. The patient reports his systolic BP can be anywhere between 180-120. PCP sent here today for elevated blood pressure. The patient is currently on one medication for his BP and he reports compliancy. Denies any headache, blurry vision, or weakness.  Review of Systems  Positive:  Negative:   Physical Exam  BP (!) 222/135 (BP Location: Right Arm)   Pulse 76   Temp 98.1 F (36.7 C)   Resp 18   SpO2 95%  Gen:   Awake, no distress   Resp:  Normal effort  MSK:   Moves extremities without difficulty  Other:    Medical Decision Making  Medically screening exam initiated at 11:16 AM.  Appropriate orders placed.  Shenandoah Vandergriff was informed that the remainder of the evaluation will be completed by another provider, this initial triage assessment does not replace that evaluation, and the importance of remaining in the ED until their evaluation is complete.  Will order basic labs.    Achille Rich, [place] 03/18/22 1117

## 2022-03-18 NOTE — ED Triage Notes (Signed)
Pt states he was at PCP yesterday for a routine check-up and they noted his BP to be high. Pt does not recall exact number, but states that they recommended he come to ED for eval. Pt prescribed Telmisartan 40 mg daily, states he has good compliance with same. Pt denies headache, vision changes, CP.

## 2022-03-18 NOTE — ED Notes (Signed)
MD dixon in the room at this time.

## 2022-03-18 NOTE — ED Provider Notes (Signed)
[facility]-EMERGENCY DEPT Provider Note   CSN: [id number] Arrival date & time: 03/18/22  1046     History  Chief Complaint  Patient presents with   Hypertension    Christopher Nolan is a 66 y.o. male.   Hypertension Pertinent negatives include no chest pain, no abdominal pain, no headaches and no shortness of breath.  Patient presents for asymptomatic hypertension.  Medical history includes HTN, HLD, DM.  He denies any recent physical symptoms.  He was seen by his doctor for a routine visit yesterday.  While there, his blood pressure was noted to be elevated.  He was advised to go to the ED for further evaluation.  Patient declined to come to the ED yesterday but did come this morning at the request of his wife.  Patient currently takes telmisartan for blood pressure control.  He states that he has been on this medication for several months.  He was previously prescribed lisinopril-HCTZ and amlodipine for blood pressure.  This was many years ago.  He typically does not check his blood pressure but states that, when he would check his blood pressure over the past several months, it would usually be elevated.  Currently, patient continues to deny any physical symptoms.  His only other medications are a statin and vitamin supplements.    Home Medications Prior to Admission medications   Medication Sig Start Date End Date Taking? Authorizing Provider  amLODipine (NORVASC) 10 MG tablet Take 1 tablet (10 mg total) by mouth daily. 03/18/22 06/16/22 Yes Gloris Manchester, MD  aspirin EC 81 MG tablet Take 81 mg by mouth daily. Swallow whole.   Yes [provider]  cholecalciferol (VITAMIN D3) 25 MCG (1000 UNIT) tablet Take 1,000 Units by mouth daily.   Yes [provider]  JANUVIA 100 MG tablet Take 100 mg by mouth daily. 02/27/22  Yes [provider]  Multiple Vitamins-Minerals (CENTRUM SILVER ADULT 50+) TABS Take 1 tablet by mouth daily.   Yes [provider]  rosuvastatin (CRESTOR) 20 MG tablet Take 20 mg by mouth daily. 02/27/22  Yes [provider]  telmisartan (MICARDIS) 40 MG tablet Take 1 tablet (40 mg total) by mouth daily. 03/18/22 06/16/22  Gloris Manchester, MD      Allergies    Labetalol    Review of Systems   Review of Systems  Constitutional:  Negative for fever.  Respiratory:  Negative for shortness of breath.   Cardiovascular:  Negative for chest pain.  Gastrointestinal:  Negative for abdominal pain and nausea.  Neurological:  Negative for weakness, light-headedness, numbness and headaches.  All other systems reviewed and are negative.   Physical Exam Updated Vital Signs BP (!) 173/100   Pulse 78   Temp 98.1 F (36.7 C)   Resp 20   SpO2 97%  Physical Exam Vitals and nursing note reviewed.  Constitutional:      General: He is not in acute distress.    Appearance: Normal appearance. He is well-developed and normal weight. He is not ill-appearing, toxic-appearing or diaphoretic.  HENT:     Head: Normocephalic and atraumatic.     Right Ear: External ear normal.     Left Ear: External ear normal.     Nose: Nose normal.     Mouth/Throat:     Mouth: Mucous membranes are moist.     Pharynx: Oropharynx is clear.  Eyes:     Extraocular Movements: Extraocular movements intact.     Conjunctiva/sclera: Conjunctivae normal.  Cardiovascular:     Rate and Rhythm: Normal rate and regular rhythm.     Heart sounds: No murmur heard. Pulmonary:     Effort: Pulmonary effort is normal. No respiratory distress.     Breath sounds: Normal breath sounds. No wheezing or rales.  Chest:     Chest wall: No tenderness.  Abdominal:     Palpations: Abdomen is soft.     Tenderness: There is no abdominal tenderness.  Musculoskeletal:        General: No swelling. Normal range of motion.     Cervical back: Normal range of motion and neck supple.     Right lower leg: No edema.     Left lower leg: No edema.  Skin:     General: Skin is warm and dry.     Capillary Refill: Capillary refill takes less than 2 seconds.     Coloration: Skin is not jaundiced or pale.  Neurological:     General: No focal deficit present.     Mental Status: He is alert and oriented to person, place, and time.     Cranial Nerves: No cranial nerve deficit.     Sensory: No sensory deficit.     Motor: No weakness.     Coordination: Coordination normal.  Psychiatric:        Mood and Affect: Mood normal.        Behavior: Behavior normal.        Thought Content: Thought content normal.        Judgment: Judgment normal.     ED Results / Procedures / Treatments   Labs (all labs ordered are listed, but only abnormal results are displayed) Labs Reviewed  COMPREHENSIVE METABOLIC PANEL - Abnormal; Notable for the following components:      Result Value   Glucose, Bld 179 (*)    All other components within normal limits  URINALYSIS, ROUTINE W REFLEX MICROSCOPIC - Abnormal; Notable for the following components:   Color, Urine STRAW (*)    Specific Gravity, Urine 1.004 (*)    All other components within normal limits  CBC WITH DIFFERENTIAL/PLATELET  MAGNESIUM    EKG EKG Interpretation  Date/Time:  Tuesday March 18 2022 13:43:41 EDT Ventricular Rate:  56 PR Interval:  205 QRS Duration: 85 QT Interval:  499 QTC Calculation: 482 R Axis:   5 Text Interpretation: Sinus rhythm RSR' in V1 or V2, right VCD or RVH Borderline prolonged QT interval Confirmed by Gloris Manchester ([id number]) on 03/18/2022 2:36:53 PM  Radiology CT Head Wo Contrast  Result Date: 03/18/2022 CLINICAL DATA:  Headache, new or worsening (Age >= 50y) EXAM: CT HEAD WITHOUT CONTRAST TECHNIQUE: Contiguous axial images were obtained from the base of the skull through the vertex without intravenous contrast. RADIATION DOSE REDUCTION: This exam was performed according to the departmental dose-optimization program which includes automated exposure control, adjustment of the mA  and/or kV according to patient size and/or use of iterative reconstruction technique. COMPARISON:  CT head January 26, 2020. FINDINGS: Brain: No evidence of acute infarction, hemorrhage, hydrocephalus, extra-axial collection or mass lesion/mass effect. 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 Patchy white matter hypodensities, compatible with chronic microvascular ischemic disease. Vascular: Calcific intracranial atherosclerosis. No hyperdense vessel identified. Skull: No acute fracture. Sinuses/Orbits: Clear sinuses.  No acute orbital findings. Other: No mastoid effusions. IMPRESSION: 1. No evidence of acute intracranial abnormality. 2. Chronic microvascular ischemic disease. Electronically Signed   By: Feliberto Harts M.D.   On: 03/18/2022 14:52  DG Chest Portable 1 View  Result Date: 03/18/2022 CLINICAL DATA:  Elevated blood pressure. EXAM: PORTABLE CHEST 1 VIEW COMPARISON:  Radiographs 01/26/2020 and 03/11/2016. FINDINGS: 1421 hours. The heart size and mediastinal contours are stable with stable aortic tortuosity. The lungs are clear. There is no pleural effusion or pneumothorax. No acute osseous findings are evident. Telemetry leads overlie the chest. IMPRESSION: Stable chest.  No evidence of active cardiopulmonary process. Electronically Signed   By: Carey Bullocks M.D.   On: 03/18/2022 14:33    Procedures Procedures    Medications Ordered in ED Medications  nitroGLYCERIN (NITROGLYN) 2 % ointment 1 inch (1 inch Topical Given 03/18/22 1417)  amLODipine (NORVASC) tablet 10 mg (10 mg Oral Given 03/18/22 1422)  isosorbide mononitrate (IMDUR) 24 hr tablet 30 mg (30 mg Oral Given 03/18/22 1538)    ED Course/ Medical Decision Making/ A&P                           Medical Decision Making Amount and/or Complexity of Data Reviewed Labs: ordered. Radiology: ordered.  Risk Prescription drug management.   This patient presents to the ED for concern of hypertension, this involves an extensive  number of treatment options, and is a complaint that carries with it a high risk of complications and morbidity.  The differential diagnosis includes essential hypertension, medication nonadherence, undertreated chronic hypertension, renal artery stenosis, hypertensive urgency, hypertensive emergency   Co morbidities that complicate the patient evaluation  Hypertension, high cholesterol   Additional history obtained:  Additional history obtained from patient's wife External records from outside source obtained and reviewed including EMR   Lab Tests:  I Ordered, and personally interpreted labs.  The pertinent results include: Normal findings   Imaging Studies ordered:  I ordered imaging studies including chest x-ray, CT head I independently visualized and interpreted imaging which showed no acute findings I agree with the radiologist interpretation   Cardiac Monitoring: / EKG:  The patient was maintained on a cardiac monitor.  I personally viewed and interpreted the cardiac monitored which showed an underlying rhythm of: Sinus rhythm   Problem List / ED Course / Critical interventions / Medication management  Patient is a healthy 66 year old male presenting for asymptomatic hypertension.  This was identified at a routine doctor visit yesterday.  Patient is currently on 1 blood pressure agent (telmisartan) which she states that he has been taking as prescribed.  On arrival in the ED, he is well-appearing.  His blood pressure is quite elevated but he denies any current symptoms.  He has no focal neurologic deficits on exam.  Patient underwent basic lab work, chest x-ray, and CT head to assess for possible endorgan damage.  He was given nitroglycerin ointment as well as amlodipine (which she was previously prescribed but no longer takes) for management of his elevated blood pressure.  Low-dose Imdur was given.  Patient had good response to blood pressure medications.  His work-up is  reassuring.  He remained asymptomatic.  Patient was advised to continue taking his telmisartan.  Additionally, he was prescribed amlodipine for further treatment of his hypertension.  He was advised to follow-up with his primary care doctor to discuss any further medication changes that will need to be made for long-term management of his hypertension.  He was discharged in good condition. I ordered medication including NTG ointment, amlodipine, Imdur for hypertension Reevaluation of the patient after these medicines showed that the patient improved I have reviewed  the patients home medicines and have made adjustments as needed   Social Determinants of Health:  Has PCP        Final Clinical Impression(s) / ED Diagnoses Final diagnoses:  Hypertension, unspecified type    Rx / DC Orders ED Discharge Orders          Ordered    telmisartan (MICARDIS) 40 MG tablet  Daily        03/18/22 1549    amLODipine (NORVASC) 10 MG tablet  Daily        03/18/22 1549              Gloris Manchester, MD 03/19/22 [phone]

## 2023-03-10 IMAGING — DX DG CHEST 1V PORT
1 series · 1 of 1 positions shown · non-contrast
Comparison: Radiographs 01/26/2020 and 03/11/2016.

CLINICAL DATA: Elevated blood pressure.

EXAM:
PORTABLE CHEST 1 VIEW

[chest ap]
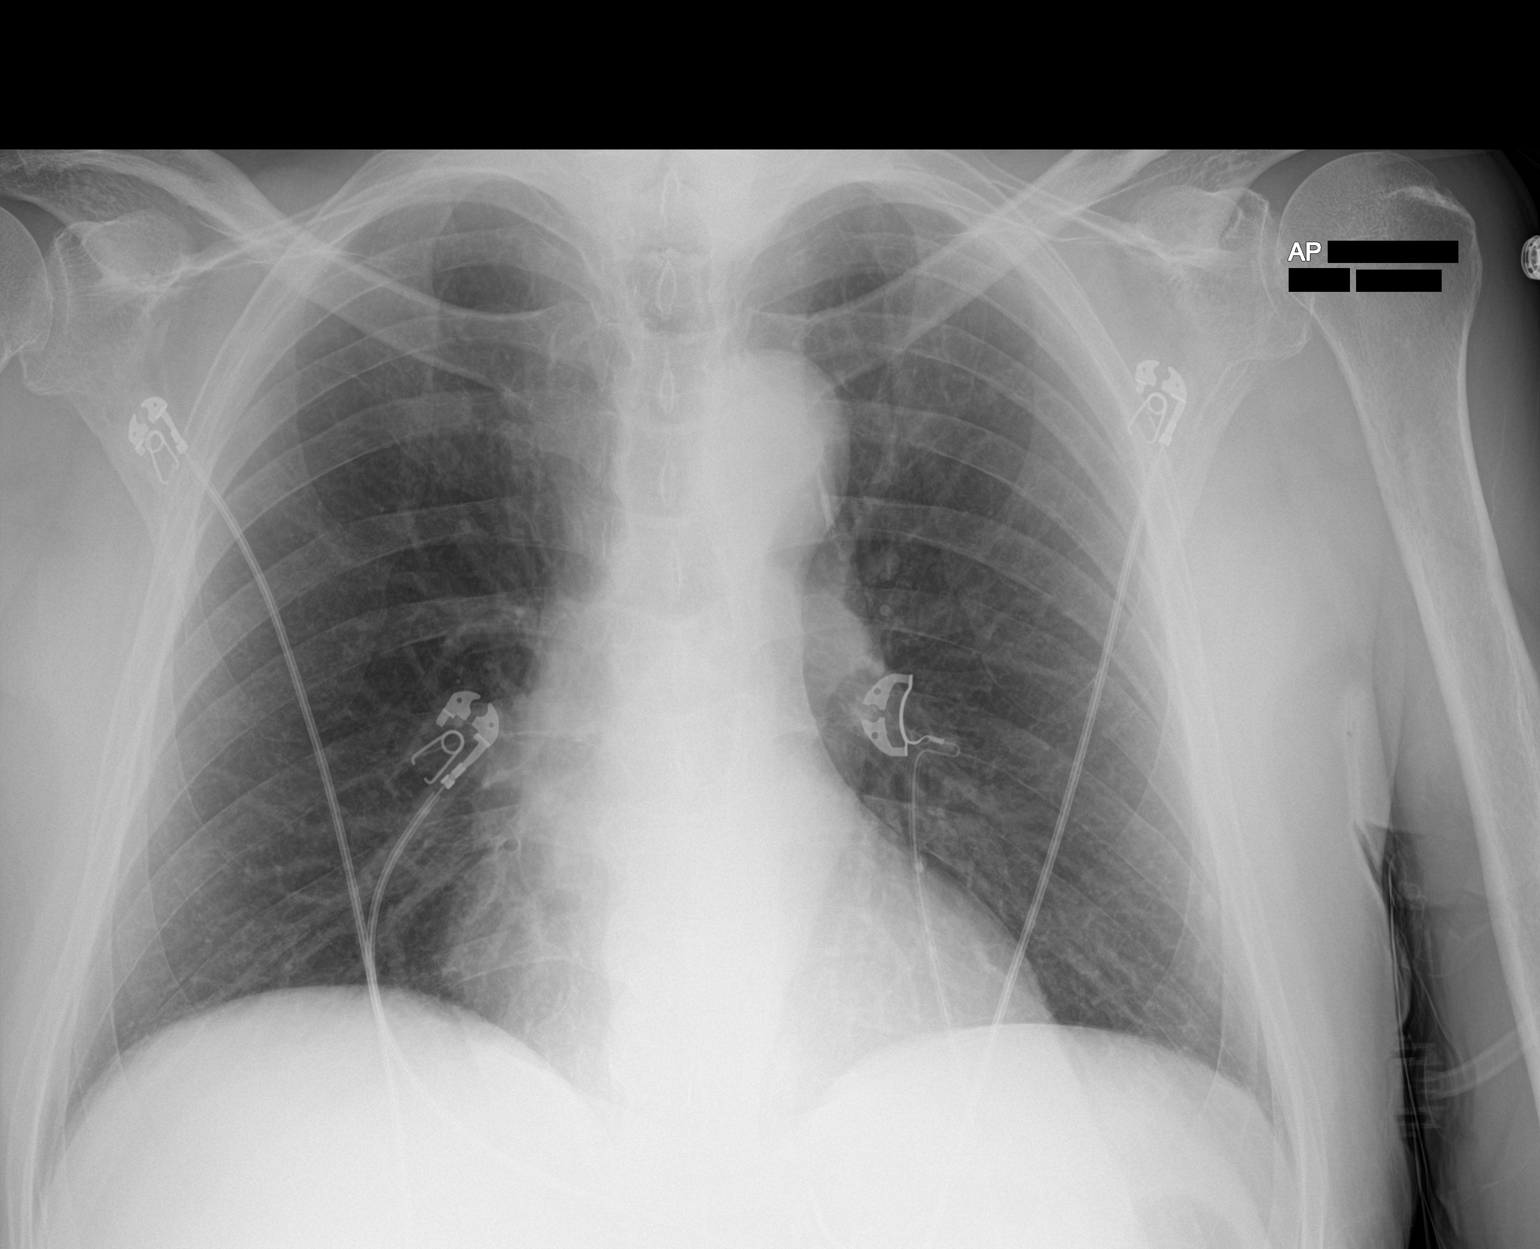

[1 of 1 positions shown; findings below may reference images not displayed]

FINDINGS: 0950 hours. The heart size and mediastinal contours are stable with
stable aortic tortuosity. The lungs are clear. There is no pleural
effusion or pneumothorax. No acute osseous findings are evident.
Telemetry leads overlie the chest.
IMPRESSION: Stable chest.  No evidence of active cardiopulmonary process.

## 2023-03-10 IMAGING — CT CT HEAD W/O CM
3 series · 16 of 47 positions shown, 19 images · non-contrast
Comparison: CT head January 26, 2020.

CLINICAL DATA: Headache, new or worsening (Age >= 50y)



[Series 2: head wo · axial · 0.47mm/px · z∈[-110,+35]mm · 10 of 35 slices shown, 13 images]
[im 3/35  brain]
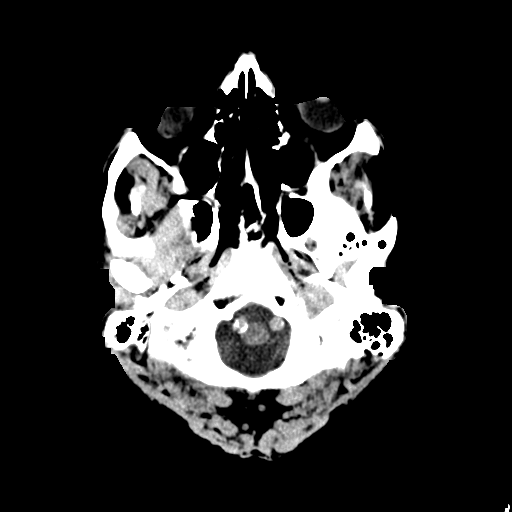
[im 3/35  bone]
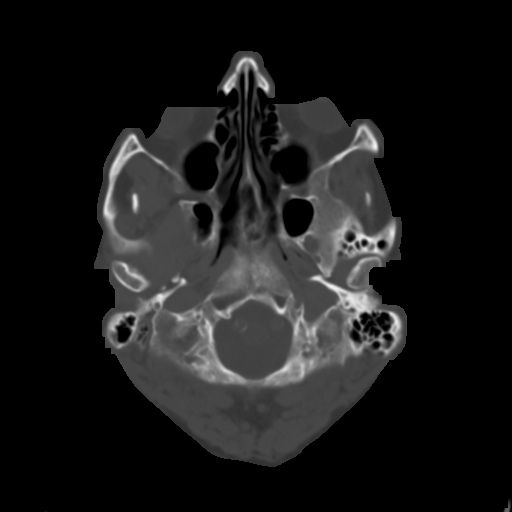
[im 6/35  brain]
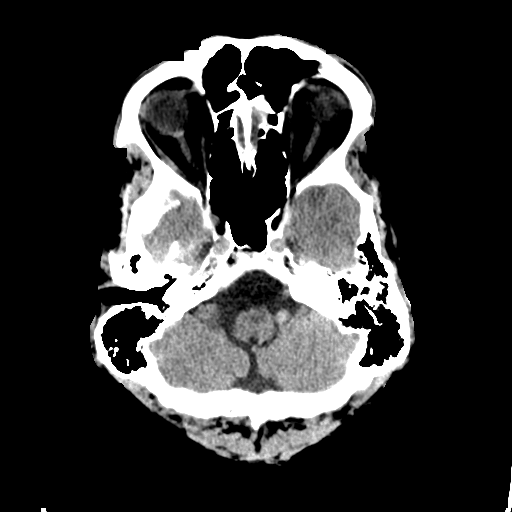
[im 10/35  brain]
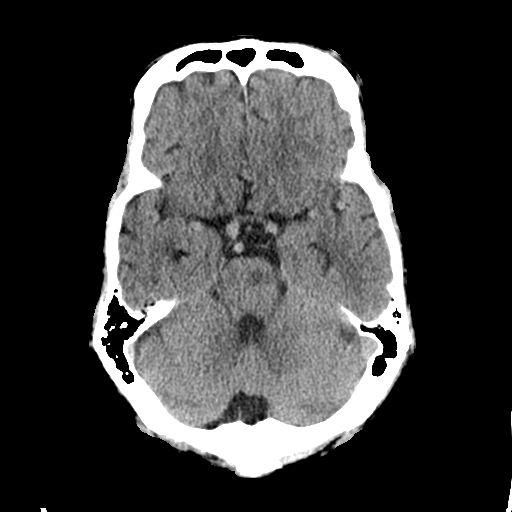
[im 12/35  brain]
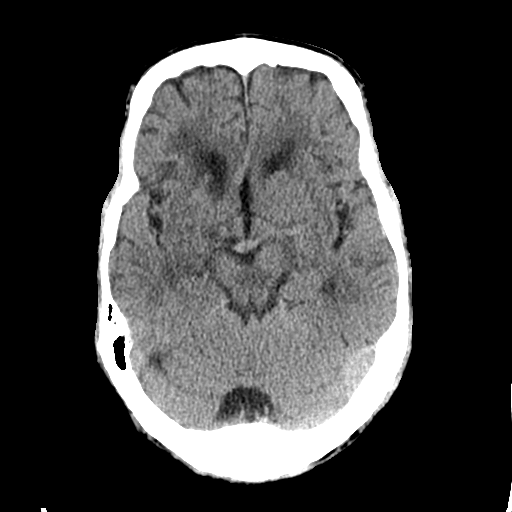
[im 16/35  brain]
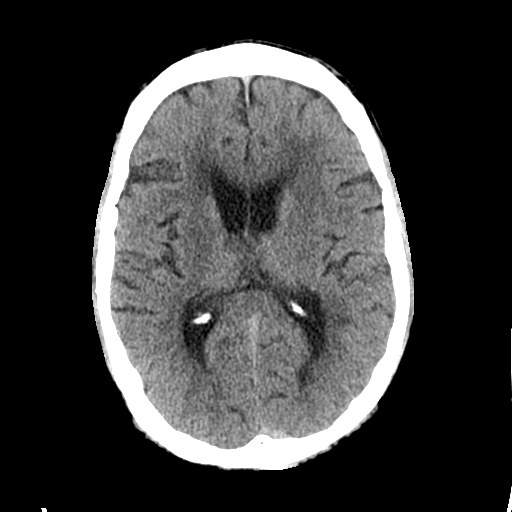
[im 16/35  bone]
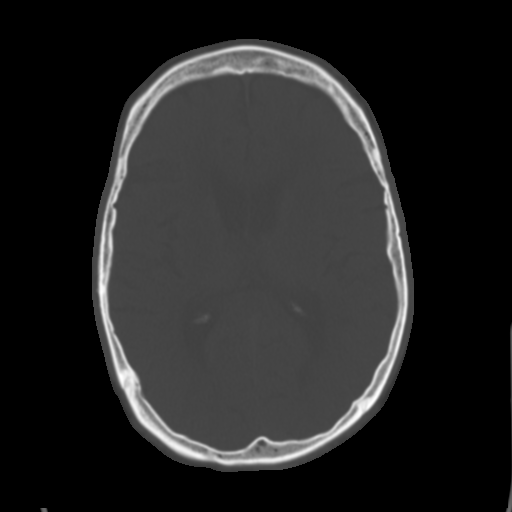
[im 19/35  brain]
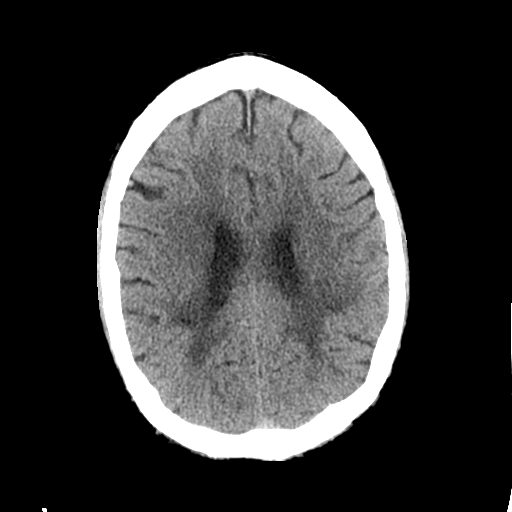
[im 23/35  brain]
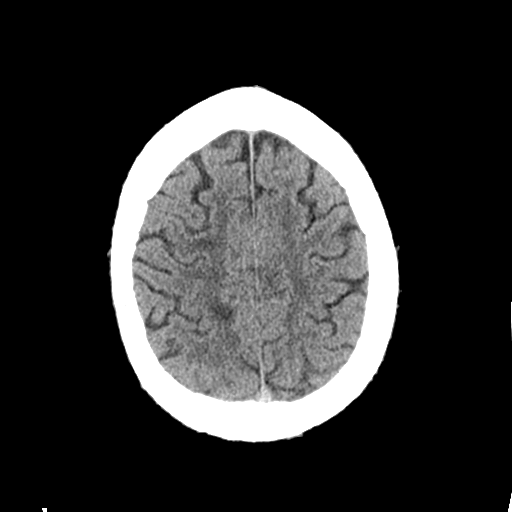
[im 26/35  brain]
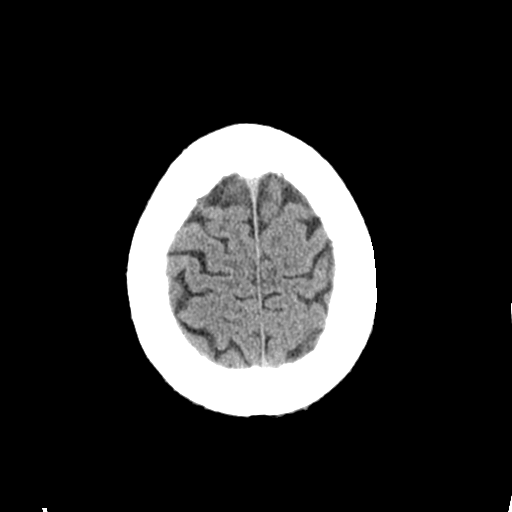
[im 29/35  brain]
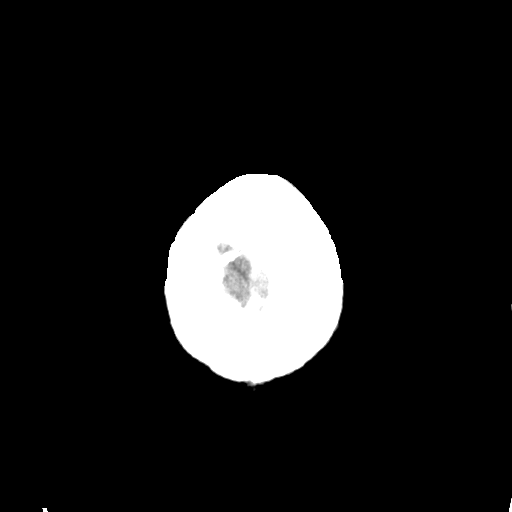
[im 29/35  bone]
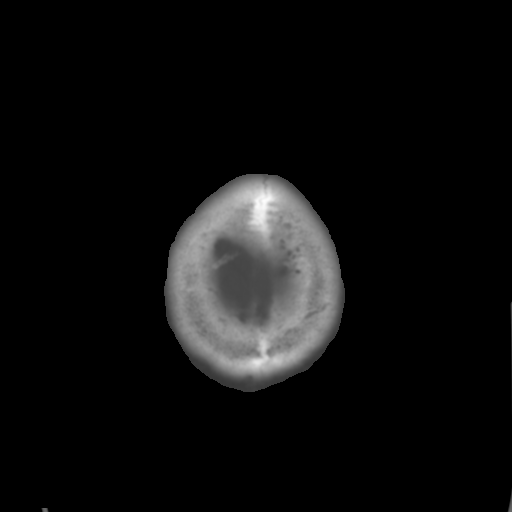
[im 32/35  brain]
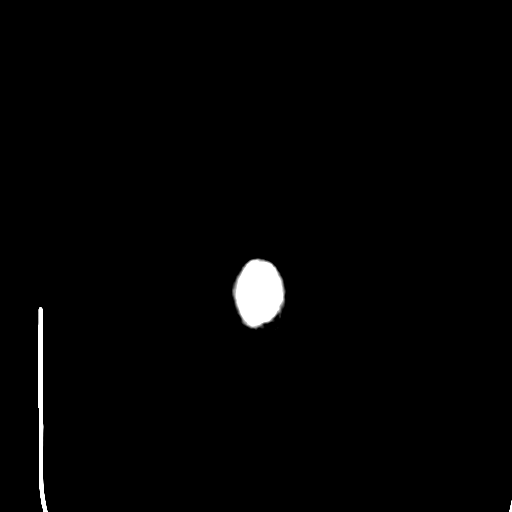

[Series 5: sagittal soft tissue · sagittal · 0.35mm/px · 3 of 63 slices shown]
[im 21/63  brain]
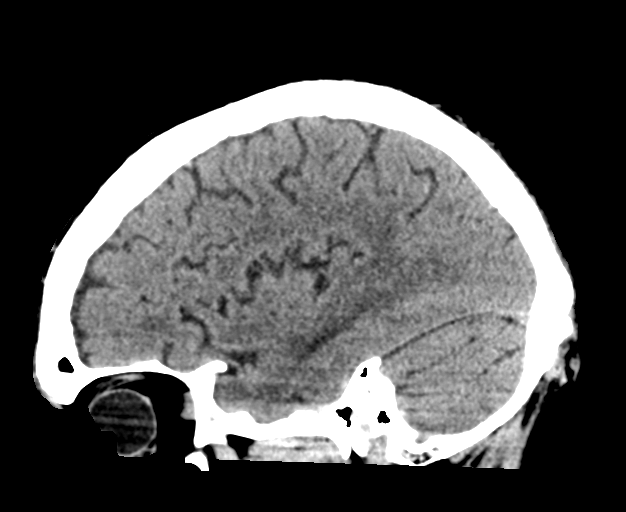
[im 32/63  brain]
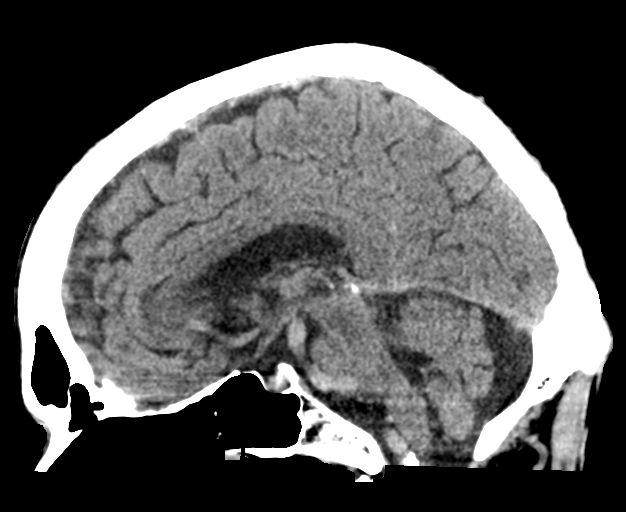
[im 42/63  brain]
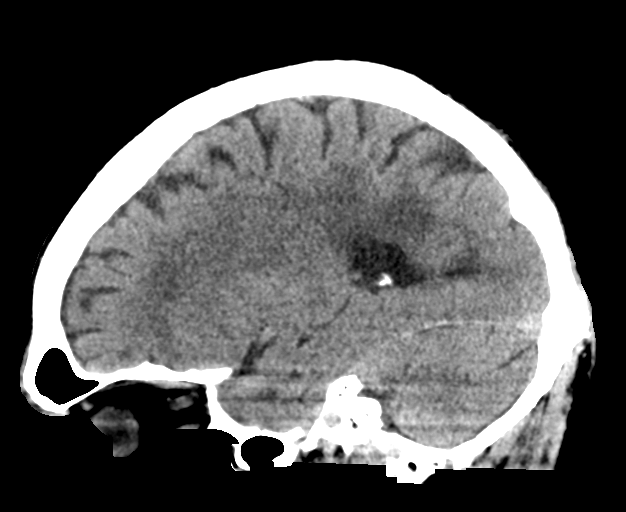

[Series 6: coronal soft tissue · coronal · 0.35mm/px · 3 of 74 slices shown]
[im 25/74  brain]
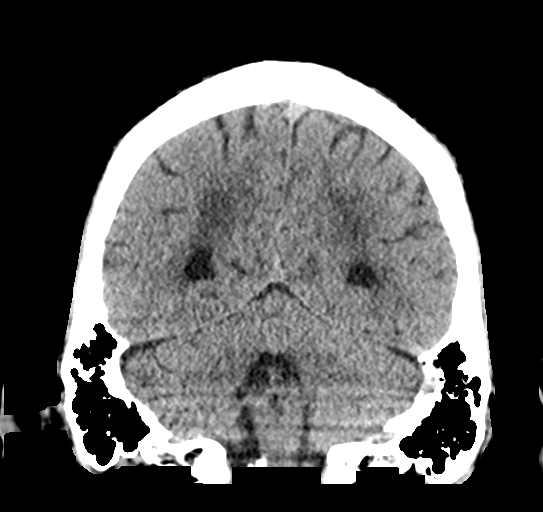
[im 33/74  brain]
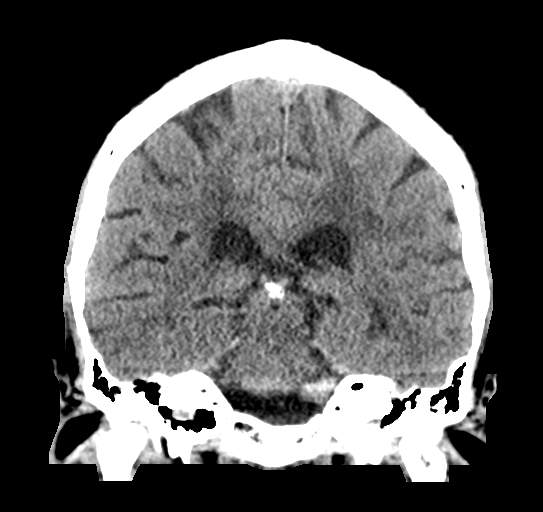
[im 41/74  brain]
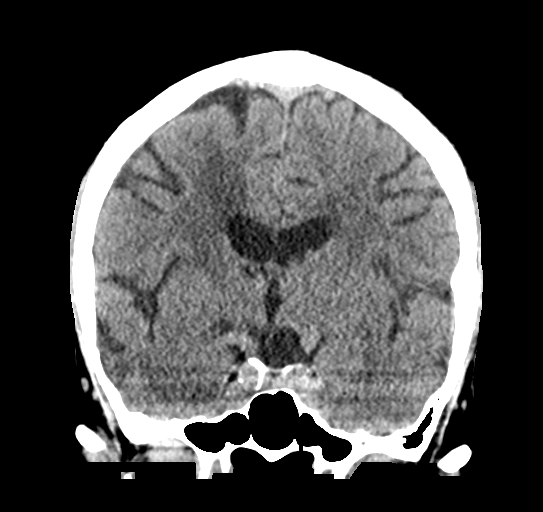

[16 of 47 positions shown; findings below may reference images not displayed]

FINDINGS: Brain: No evidence of acute infarction, hemorrhage, hydrocephalus,
extra-axial collection or mass lesion/mass effect. Remote infarcts
in the right basal ganglia. Patchy white matter hypodensities,
compatible with chronic microvascular ischemic disease.

Vascular: Calcific intracranial atherosclerosis. No hyperdense
vessel identified.

Skull: No acute fracture.

Sinuses/Orbits: Clear sinuses.  No acute orbital findings.

Other: No mastoid effusions.
IMPRESSION: 1. No evidence of acute intracranial abnormality.
2. Chronic microvascular ischemic disease.

## 2024-02-04 ENCOUNTER — Encounter: Payer: Self-pay | Admitting: Physician Assistant

## 2024-03-21 ENCOUNTER — Other Ambulatory Visit (INDEPENDENT_AMBULATORY_CARE_PROVIDER_SITE_OTHER)

## 2024-03-21 ENCOUNTER — Ambulatory Visit: Admitting: Physician Assistant

## 2024-03-21 ENCOUNTER — Ambulatory Visit: Payer: Self-pay | Admitting: Gastroenterology

## 2024-03-21 ENCOUNTER — Encounter: Payer: Self-pay | Admitting: Physician Assistant

## 2024-03-21 VITALS — BP 110/70 | HR 84 | Ht 70.75 in | Wt 176.1 lb

## 2024-03-21 DIAGNOSIS — R011 Cardiac murmur, unspecified: Secondary | ICD-10-CM | POA: Diagnosis not present

## 2024-03-21 DIAGNOSIS — R195 Other fecal abnormalities: Secondary | ICD-10-CM | POA: Diagnosis not present

## 2024-03-21 DIAGNOSIS — K5900 Constipation, unspecified: Secondary | ICD-10-CM

## 2024-03-21 LAB — CBC WITH DIFFERENTIAL/PLATELET
Basophils Absolute: 0 10*3/uL (ref 0.0–0.1)
Basophils Relative: 0.3 % (ref 0.0–3.0)
Eosinophils Absolute: 0.1 10*3/uL (ref 0.0–0.7)
Eosinophils Relative: 1 % (ref 0.0–5.0)
HCT: 39.7 % (ref 39.0–52.0)
Hemoglobin: 13.9 g/dL (ref 13.0–17.0)
Lymphocytes Relative: 22.5 % (ref 12.0–46.0)
Lymphs Abs: 2 10*3/uL (ref 0.7–4.0)
MCHC: 35 g/dL (ref 30.0–36.0)
MCV: 86.2 fl (ref 78.0–100.0)
Monocytes Absolute: 0.6 10*3/uL (ref 0.1–1.0)
Monocytes Relative: 6.2 % (ref 3.0–12.0)
Neutro Abs: 6.2 10*3/uL (ref 1.4–7.7)
Neutrophils Relative %: 70 % (ref 43.0–77.0)
Platelets: 269 10*3/uL (ref 150.0–400.0)
RBC: 4.6 Mil/uL (ref 4.22–5.81)
RDW: 12.4 % (ref 11.5–15.5)
WBC: 8.9 10*3/uL (ref 4.0–10.5)

## 2024-03-21 MED ORDER — POLYETHYLENE GLYCOL 3350 17 G PO PACK: 17.0000 g | PACK | Freq: Every day | ORAL | Status: DC

## 2024-03-21 NOTE — Progress Notes (Signed)
 Brigitte Canard, PA-C [address] [place], [place]  [address] Phone: [phone]   Gastroenterology Consultation  Referring Provider:     Armida Berry, NP Primary Care Physician:  Candiss Chamorro, MD Primary Gastroenterologist:  Brigitte Canard, PA-C / Darol Elizabeth, MD  Reason for Consultation:     Positive Hemosure        HPI:   Christopher Nolan is a 68 y.o. y/o male referred for consultation & management  by Candiss Chamorro, MD.    Referred from Armida Berry, NP, at [facility] practice for positive screening Hemosure test.  No previous colonoscopy or GI evaluation.  No Family history of colon cancer.  Patient has mild to moderate constipation.  Typically has bowel movement once per week.  No other GI symptoms.  Denies abdominal pain, heartburn, dysphagia, rectal bleeding, weight loss, anemia, or any other GI symptoms.  No current treatment for constipation.  Of note, he has a loud heart murmur on exam today.  He has never seen a cardiologist.  He has never been told that he had a heart murmur.  He has had a couple of ED visits for near syncope episodes in the past few years.  No previous cardiac evaluation.  Denies recent chest pain or shortness of breath.  Has history of hypertension, hyperlipidemia, and diabetes.  Past Medical History:  Diagnosis Date   Diabetes mellitus without complication (HCC)    Hyperlipidemia    Hypertension     Past Surgical History:  Procedure Laterality Date   NO PAST SURGERIES      Prior to Admission medications   Medication Sig Start Date End Date Taking? Authorizing Provider  amLODipine  (NORVASC ) 10 MG tablet Take 1 tablet (10 mg total) by mouth daily. 03/18/22 06/16/22  Iva Mariner, MD  aspirin EC 81 MG tablet Take 81 mg by mouth daily. Swallow whole.    [provider]  cholecalciferol (VITAMIN D3) 25 MCG (1000 UNIT) tablet Take 1,000 Units by mouth daily.    [provider]  JANUVIA 100  MG tablet Take 100 mg by mouth daily. 02/27/22   [provider]  Multiple Vitamins-Minerals (CENTRUM SILVER ADULT 50+) TABS Take 1 tablet by mouth daily.    [provider]  rosuvastatin (CRESTOR) 20 MG tablet Take 20 mg by mouth daily. 02/27/22   [provider]  telmisartan  (MICARDIS ) 40 MG tablet Take 1 tablet (40 mg total) by mouth daily. 03/18/22 06/16/22  Iva Mariner, MD    Family History  Problem Relation Age of Onset   Diabetes Mother    Stomach cancer Father    Hypertension Father    Hyperlipidemia Father    Diabetes Sister    Diabetes Brother    Cancer Maternal Grandmother        type unknown     Social History   Tobacco Use   Smoking status: Never   Smokeless tobacco: Current    Types: Chew  Vaping Use   Vaping status: Never Used  Substance Use Topics   Alcohol use: No   Drug use: No    Allergies as of 03/21/2024 - Review Complete 03/21/2024  Allergen Reaction Noted   Labetalol Other (See Comments) 01/27/2020    Review of Systems:    All systems reviewed and negative except where noted in HPI.   Physical Exam:  BP 110/70 (BP Location: Left Arm, Patient Position: Sitting, Cuff Size: Normal)   Pulse 84   Ht  5' 10.75 (1.797 m) Comment: height measured wihtout shoes  Wt 176 lb 2 oz (79.9 kg)   BMI 24.74 kg/m  No LMP for male patient.  General:   Alert,  Well-developed, well-nourished, pleasant and cooperative in NAD Lungs:  Respirations even and unlabored.  Clear throughout to auscultation.   No wheezes, crackles, or rhonchi. No acute distress. Heart:  Regular rate and rhythm; Loud Grade 3 - 4 systolic heart murmur worse sitting up and better laying down.  No clicks, rubs, or gallops. Abdomen:  Normal bowel sounds.  No bruits.  Soft, and non-distended without masses, hepatosplenomegaly or hernias noted.  No Tenderness.  No guarding or rebound tenderness.    Neurologic:  Alert and oriented x3;  grossly normal neurologically. Psych:   Alert and cooperative. Normal mood and affect.  Imaging Studies: No results found.  Labs: CBC    Component Value Date/Time   WBC 8.9 03/21/2024 0933   RBC 4.60 03/21/2024 0933   HGB 13.9 03/21/2024 0933   HCT 39.7 03/21/2024 0933   PLT 269.0 03/21/2024 0933   MCV 86.2 03/21/2024 0933   MCV 91.9 05/18/2012 1659   MCH 29.9 03/18/2022 1142   MCHC 35.0 03/21/2024 0933   RDW 12.4 03/21/2024 0933   LYMPHSABS 2.0 03/21/2024 0933   MONOABS 0.6 03/21/2024 0933   EOSABS 0.1 03/21/2024 0933   BASOSABS 0.0 03/21/2024 0933    CMP     Component Value Date/Time   NA 139 03/18/2022 1142   K 3.5 03/18/2022 1142   CL 101 03/18/2022 1142   CO2 30 03/18/2022 1142   GLUCOSE 179 (H) 03/18/2022 1142   BUN 11 03/18/2022 1142   CREATININE 1.10 03/18/2022 1142   CREATININE 0.96 05/18/2012 1650   CALCIUM 8.9 03/18/2022 1142   PROT 7.1 03/18/2022 1142   ALBUMIN 4.2 03/18/2022 1142   AST 22 03/18/2022 1142   ALT 19 03/18/2022 1142   ALKPHOS 65 03/18/2022 1142   BILITOT 0.7 03/18/2022 1142   GFRNONAA >60 03/18/2022 1142   GFRAA >60 03/11/2016 0925    Assessment and Plan:   Burns Timson is a 68 y.o. y/o male has been referred for:  Positive Screening Hemosure; No previous Colonoscopy; No symptoms of GI bleed; No family history of Colon Cancer. - Lab: CBC with Diff.  Screen for Anemia.  Loud Heart Murmur - Refer to Cardiology for Echocardiogram; Evaluate for Aortic Stenosis - After Cardiology Evaluation, then we can schedule a Colonoscopy, pending cardiac recommendations.  Constipation - Discussed constipation treatment at length. - Recommend High Fiber diet with fruits, vegetables, and whole grains. - Drink 64 ounces of Fluids Daily. - Start Miralax Mix 1 - 2 capfuls in a drink daily. - If no improvement, consider Linzess, Amitiza or Trulance.   Follow up after Cardiac Evaluation to Schedule Colonoscopy.  Brigitte Canard, PA-C

## 2024-03-21 NOTE — Patient Instructions (Addendum)
 Please go to the lab in the basement of our building to have lab work done as you leave today. Hit B for basement when you get on the elevator.  When the doors open the lab is on your left.  We will call you with the results. Thank you.  We are referring you to [facility].  They will contact you directly to schedule an appointment.  It may take a week or more before you hear from them.  Please feel free to contact us  if you have not heard from them within 2 weeks and we will follow up on the referral.   Please let us  know once you have been evaluated by Cardiology and we will schedule you for a colonoscopy.  Please purchase the following medications over the counter and take as directed: Miralax - Mix 1 or 2 capfuls in a drink once daily.  Take every day for Constipaiton.  Thank you for entrusting me with your care and for choosing [facility], Brigitte Canard, [place]   If your blood pressure at your visit was 140/90 or greater, please contact your primary care physician to follow up on this. ______________________________________________________  If you are age 69 or older, your body mass index should be between 23-30. Your Body mass index is 24.74 kg/m. If this is out of the aforementioned range listed, please consider follow up with your Primary Care Provider.  If you are age 69 or younger, your body mass index should be between 19-25. Your Body mass index is 24.74 kg/m. If this is out of the aformentioned range listed, please consider follow up with your Primary Care Provider.  ________________________________________________________  The [facility] providers would like to encourage you to use MYCHART to communicate with providers for non-urgent requests or questions.  Due to long hold times on the telephone, sending your provider a message by [facility] may be a faster and more efficient way to get a response.  Please allow 48 business hours for a response.  Please remember that this  is for non-urgent requests.  _______________________________________________________  Due to recent changes in healthcare laws, you may see the results of your imaging and laboratory studies on MyChart before your provider has had a chance to review them.  We understand that in some cases there may be results that are confusing or concerning to you. Not all laboratory results come back in the same time frame and the provider may be waiting for multiple results in order to interpret others.  Please give us  48 hours in order for your provider to thoroughly review all the results before contacting the office for clarification of your results.

## 2024-03-28 NOTE — Progress Notes (Signed)
 Agree with the assessment and plan as outlined by Brigitte Canard, PA-C.

## 2024-04-04 ENCOUNTER — Telehealth: Payer: Self-pay | Admitting: Physician Assistant

## 2024-04-04 DIAGNOSIS — Z1211 Encounter for screening for malignant neoplasm of colon: Secondary | ICD-10-CM

## 2024-04-04 NOTE — Telephone Encounter (Signed)
 Spoke to patient to advise of cardiology appointment 07/05/24 at 940 with Dr Santo at [facility]. Location. I asked patient to write this information down so he would have it for future reference but he states that he cannot get a pen or paper right now. I have asked that he look in Mychart later this afternoon to get appropriate appointment information and place on his calendar. Patient verbalizes understanding. Patient is in agreement to complete cologuard while we are awaiting cardiac approval for colonoscopy procedure. Cologuard lab order entered in EPIC.

## 2024-04-04 NOTE — Telephone Encounter (Signed)
 I saw patient 03/21/2024 for positive Hemosure screening test.  Patient had loud heart murmur on exam and I referred him for cardiology evaluation before we schedule a colonoscopy.  I do not see where patient has appointment with cardiologist.  Please call patient to see if he has been contacted by cardiology to schedule appointment to evaluate heart murmur?  He needs to get this done so we can schedule a colonoscopy.  Age 68 and he has never had a colonoscopy. Ellouise Console, PA-C

## 2024-04-04 NOTE — Telephone Encounter (Signed)
 Cardiology referral was placed at visit on 03/21/24. Patient has not yet been contacted by cardiology  I contacted cardiology and scheduled the appointment. First available date is Tuesday, 07/05/24 at 940 am with Dr Santo at [facility] location.

## 2024-04-04 NOTE — Addendum Note (Signed)
 Addended by: CLAUDENE NAOMIE SAILOR on: 04/04/2024 02:11 PM   Modules accepted: Orders

## 2024-04-14 ENCOUNTER — Telehealth: Payer: Self-pay | Admitting: Physician Assistant

## 2024-04-14 NOTE — Telephone Encounter (Signed)
Note faxed through epic

## 2024-04-14 NOTE — Telephone Encounter (Signed)
 Christopher Nolan with [facility] is calling to have the office notes from 6/16 sent to them. Fax to [phone]

## 2024-04-19 LAB — COLOGUARD: COLOGUARD: NEGATIVE

## 2024-04-20 ENCOUNTER — Ambulatory Visit: Payer: Self-pay | Admitting: Physician Assistant

## 2024-07-04 NOTE — Progress Notes (Unsigned)
 Cardiology Office Note:  .    Date:  07/05/2024  ID:  Christopher Nolan, DOB 29-Jan-1956, MRN [id number] PCP: Christopher Tanda Mae, MD  [facility] Providers Cardiologist:  Christopher DELENA Leavens, MD     CC: pre-evaluation colonoscopy Consulted for the evaluation of Near syncope at the behest of Dr. Jerilynn   History of Present Illness: .    Christopher Nolan is a 68 y.o. male who presents for evaluation of near syncope and pre-colonoscopy clearance. He was referred by his doctors for evaluation of near syncope to ensure there is no evidence of cardiac-related issues.  He currently feels fine and denies recent episodes of near syncope. He acknowledges having had episodes of passing out in the past, with the last episode occurring approximately a year ago. The cause of these episodes was not determined.  He is here for evaluation prior to a colonoscopy. All previous blood tests have shown negative results, and he has had a negative fecal occult blood test.  He experiences shortness of breath when walking up a steep incline in his driveway, requiring him to stop halfway to rest. This has been occurring for a while, and he recalls being able to walk up the driveway without stopping in 2021. No chest pain or shortness of breath during recent yard work.  He has a history of hypertension, managed with triamterene, hydrochlorothiazide , and amlodipine , and hyperlipidemia, managed with rosuvastatin 20 mg. He is unclear about the medications he takes for diabetes, which is managed by his primary care physician.  He lives with someone who does the cooking and cleaning, while he handles the yard work, which he last did a week ago without experiencing chest pain, shortness of breath, or near syncope.  Discussed the use of AI scribe software for clinical note transcription with the patient, who gave verbal consent to proceed.   Relevant histories: .  Social  - Employment: Not currently  employed - Living Situation: Married ROS: As per HPI.  Physical Exam:    VS:  BP 111/70   Pulse 78   Ht 5' 11 (1.803 m)   Wt 166 lb 9.6 oz (75.6 kg)   SpO2 94%   BMI 23.24 kg/m    Wt Readings from Last 3 Encounters:  07/05/24 166 lb 9.6 oz (75.6 kg)  03/21/24 176 lb 2 oz (79.9 kg)  03/12/16 182 lb (82.6 kg)    Gen: no distress  HEENT: No JVD,  Poor dentition Ears: bilateral Frank Sign Cardiac: No Rubs or Gallops, harsh systolic crescendo Murmur, RRR +2 radial pulses Respiratory: Clear to auscultation bilaterally, normal effort, normal  respiratory rate GI: Soft, nontender, non-distended  MS: No  edema;  moves all extremities Integument: Skin feels warm Neuro:  At time of evaluation, alert and oriented to person/place/time/situation  Psych: Normal affect, patient feels ok   ASSESSMENT AND PLAN: .    EKG: Sinus rhythm with left ventricular hypertrophy, no first-degree heart block, increased heart rate compared to 2023 EKG (07/05/2024)  Suspected severe aortic stenosis with heart murmur Preoperative Evaluation - 68 year old male with hypertension, hyperlipidemia, and diabetes presents with suspected severe aortic stenosis. He experiences near syncope and exertional dyspnea, especially on inclines. EKG shows sinus rhythm with left ventricular hypertrophy. A heart murmur suggests aortic stenosis. Differential diagnosis includes potentially severe aortic stenosis based on symptoms and physical findings. Discussed potential need for intervention if significant aortic stenosis is confirmed. Emphasized confirming diagnosis with echocardiogram before interventions. If confirmed, a full workup will be  initiated for potential valve intervention. - Order echocardiogram to evaluate for aortic stenosis. - he has greater than 4 functional METs, if no severe AS, reasonable to proceed with colonoscopy - Follow up in December unless echocardiogram shows severe aortic stenosis, in which case  initiate full workup for potential valve intervention; has limited understanding of his overall care and has poor dentition; if severe AS suspect he would need TAVR but would need f/u evaluation  HTN- controlled on current therapy (no changes) HLD- continue current therapy  Needs Pedoff Eval with apicals and RUSB  Christopher Leavens, MD FASE [facility] Cardiologist [facility]  [address] [place], [place] [address] [phone]  11:20 AM

## 2024-07-05 ENCOUNTER — Ambulatory Visit: Attending: Internal Medicine | Admitting: Internal Medicine

## 2024-07-05 VITALS — BP 111/70 | HR 78 | Ht 71.0 in | Wt 166.6 lb

## 2024-07-05 DIAGNOSIS — E782 Mixed hyperlipidemia: Secondary | ICD-10-CM | POA: Diagnosis not present

## 2024-07-05 DIAGNOSIS — I10 Essential (primary) hypertension: Secondary | ICD-10-CM

## 2024-07-05 DIAGNOSIS — R011 Cardiac murmur, unspecified: Secondary | ICD-10-CM | POA: Diagnosis not present

## 2024-07-05 NOTE — Patient Instructions (Signed)
 Medication Instructions:  Your physician recommends that you continue on your current medications as directed. Please refer to the Current Medication list given to you today.  *If you need a refill on your cardiac medications before your next appointment, please call your pharmacy*  Lab Work: NONE If you have labs (blood work) drawn today and your tests are completely normal, you will receive your results only by: MyChart Message (if you have MyChart) OR A paper copy in the mail If you have any lab test that is abnormal or we need to change your treatment, we will call you to review the results.  Testing/Procedures: Your physician has requested that you have an echocardiogram. Echocardiography is a painless test that uses sound waves to create images of your heart. It provides your doctor with information about the size and shape of your heart and how well your heart's chambers and valves are working. This procedure takes approximately one hour. There are no restrictions for this procedure. Please do NOT wear cologne, perfume, aftershave, or lotions (deodorant is allowed). Please arrive 15 minutes prior to your appointment time.  Please note: We ask at that you not bring children with you during ultrasound (echo/ vascular) testing. Due to room size and safety concerns, children are not allowed in the ultrasound rooms during exams. Our front office staff cannot provide observation of children in our lobby area while testing is being conducted. An adult accompanying a patient to their appointment will only be allowed in the ultrasound room at the discretion of the ultrasound technician under special circumstances. We apologize for any inconvenience.   Follow-Up: At [facility], you and your health needs are our priority.  As part of our continuing mission to provide you with exceptional heart care, our providers are all part of one team.  This team includes your primary Cardiologist  (physician) and Advanced Practice Providers or APPs (Physician Assistants and Nurse Practitioners) who all work together to provide you with the care you need, when you need it.  Your next appointment:   DEC 2025  Provider:   Stanly DELENA Leavens, MD

## 2024-08-08 ENCOUNTER — Ambulatory Visit (HOSPITAL_COMMUNITY)
Admission: RE | Admit: 2024-08-08 | Discharge: 2024-08-08 | Disposition: A | Discharge status: Home / Self Care | Source: Ambulatory Visit | Attending: Cardiology | Admitting: Cardiology

## 2024-08-08 DIAGNOSIS — R011 Cardiac murmur, unspecified: Secondary | ICD-10-CM

## 2024-08-08 LAB — ECHOCARDIOGRAM COMPLETE
AR max vel: 4.43 cm2
AV Area VTI: 4.3 cm2
AV Area mean vel: 4.44 cm2
AV Mean grad: 6 mmHg
AV Peak grad: 11.6 mmHg
Ao pk vel: 1.7 m/s
Area-P 1/2: 2.89 cm2
MV M vel: 5.61 m/s
MV Peak grad: 125.7 mmHg
Radius: 0.75 cm
S' Lateral: 0.8 cm

## 2024-08-10 ENCOUNTER — Ambulatory Visit: Payer: Self-pay | Admitting: Internal Medicine

## 2024-08-10 ENCOUNTER — Telehealth: Payer: Self-pay

## 2024-08-10 NOTE — Telephone Encounter (Signed)
 Called patient and left detailed message about scheduling his Colonoscopy in December.  Asked him to call back so we can get him booked

## 2024-08-10 NOTE — Telephone Encounter (Signed)
 Called and spoke to schedule him for colonoscopy.  He indicated that he was busy and asked that I call him back later.

## 2024-08-10 NOTE — Telephone Encounter (Signed)
-----   Message from Elspeth SHAUNNA Naval sent at 08/09/2024  5:42 PM EST ----- Regarding: RE: ECHO results? Thanks Ellouise,  Unfortunately Cologuard is not a replacement for a positive stool test (hemosure) especially in a patient who has never had a colonoscopy. Unfortunately I still recommend a colonoscopy.  Looks like his echo was okay, I recommend colonoscopy to further evaluate if he is willing.  Jan can you please schedule this patient for colonoscopy at the [facility].  Jan please convert this to a phone note so it is documented in his chart. Thanks.  Dr. DELENA ----- Message ----- From: Honora Ellouise, PA-C Sent: 08/09/2024   1:34 PM EST To: Elspeth SHAUNNA Naval, MD; Clarita CHRISTELLA Favre, CMA Subject: RE: ECHO results?                              Echo showed: LVEF 65 to 70%.  Aortic valve sclerosis/ calcification is present, without any evidence of aortic stenosis. I am not sure colonoscopy is still necessary.  Patient was originally referred for positive fit test.  CBC was normal, Hgb 13.9, no anemia.  Cologuard test was negative.  Patient had no GI symptoms. Dr. Naval, please review and advise if you recommend proceed with colonoscopy? At the very least I think follow-up office visit is a good idea. Thank you, Ellouise Honora, PA-C ----- Message ----- From: Favre Clarita CHRISTELLA, CMA Sent: 08/09/2024   9:02 AM EST To: Ellouise Honora, PA-C Subject: RE: ECHO results?                              Patient's ECHO has resulted for your review ----- Message ----- From: Favre Clarita CHRISTELLA, CMA Sent: 08/09/2024  12:00 AM EST To: Clarita CHRISTELLA Favre, CMA Subject: ECHO results?                                  Armbruster, Elspeth SHAUNNA, MD  to Honora Ellouise, PA-C; Favre Clarita CHRISTELLA, CMA 07-05-24:  Ellouise - nice catch on this patient's physical exam.  Jan - we will need his echo done before scheduling colonoscopy. If he has severe AS it needs to be evaluated / treated prior to doing colonoscopy. Can you set a reminder for a chart  check to review echo result when done? Thanks  ------------------------------------------ Note - patient saw Ellouise 03-30-24   ECHO scheduled for 11-3 [facility] patient

## 2024-08-16 NOTE — Telephone Encounter (Signed)
 LM for patient to please call back so we can schedule him for a colonoscopy. Letter mailed to patient as well.

## 2024-10-04 ENCOUNTER — Ambulatory Visit: Attending: Internal Medicine | Admitting: Internal Medicine

## 2024-10-04 ENCOUNTER — Encounter: Payer: Self-pay | Admitting: Internal Medicine

## 2024-10-04 VITALS — BP 133/88 | HR 71 | Ht 71.0 in | Wt 163.0 lb

## 2024-10-04 DIAGNOSIS — E1169 Type 2 diabetes mellitus with other specified complication: Secondary | ICD-10-CM | POA: Insufficient documentation

## 2024-10-04 DIAGNOSIS — I358 Other nonrheumatic aortic valve disorders: Secondary | ICD-10-CM | POA: Insufficient documentation

## 2024-10-04 DIAGNOSIS — I10 Essential (primary) hypertension: Secondary | ICD-10-CM | POA: Diagnosis not present

## 2024-10-04 DIAGNOSIS — I340 Nonrheumatic mitral (valve) insufficiency: Secondary | ICD-10-CM | POA: Diagnosis not present

## 2024-10-04 DIAGNOSIS — E785 Hyperlipidemia, unspecified: Secondary | ICD-10-CM | POA: Diagnosis not present

## 2024-10-04 NOTE — Progress Notes (Signed)
 " Cardiology Office Note:  .    Date:  10/04/2024  ID:  Christopher Nolan, DOB [birth year], MRN [id number] PCP: Christopher Tanda Mae, MD  [facility] Providers Cardiologist:  Christopher DELENA Leavens, MD     CC: Syncope follow up  History of Present Illness: .    Christopher Nolan is a 68 y.o. male who presents after follow up for syncopal episode.  Christopher Nolan is a 68 year old male who presents for follow-up after a near syncopal event and evaluation of a heart murmur.  He experienced a near syncopal event following a knee procedure, during which he felt like he was going to pass out. Since then, there have been no further episodes of passing out or near passing out. A CT scan of his head was performed after the initial event, but no significant findings were reported.  He has a history of a prominent aortic murmur, which prompted an echocardiogram. The echocardiogram revealed mild mitral regurgitation and aortic sclerosis.  Despite previous discussions, he has not undergone a colonoscopy with Dr. Leigh. He has not experienced any chest pain or other concerning symptoms since the last visit.  His past medical history includes hypertension, diabetes, and hyperlipidemia, which are currently being managed. His blood pressure is well controlled, and his cholesterol levels are being monitored and managed by another physician.  Discussed the use of AI scribe software for clinical note transcription with the patient, who gave verbal consent to proceed.  Relevant histories: .  Social  - Employment: Not currently employed - Living Situation: Married 2025: Established with me: he denies prior CABG (see Echo report)  Cardiac Studies & Procedures   ______________________________________________________________________________________________     ECHOCARDIOGRAM  ECHOCARDIOGRAM COMPLETE 08/08/2024  Narrative ECHOCARDIOGRAM REPORT    Patient Name:   Christopher Nolan Date of Exam:  08/08/2024 Medical Rec #:  [id number]      Height:       71.0 in Accession #:    [id number]     Weight:       166.6 lb Date of Birth:  23-Dec-1955       BSA:          1.951 m Patient Age:    68 years       BP:           102/68 mmHg Patient Gender: M              HR:           63 bpm. Exam Location:  [address]  Procedure: 2D Echo, Cardiac Doppler and Color Doppler (Both Spectral and Color Flow Doppler were utilized during procedure).  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 Cardiomyopathy, CAD, Prior CABG and Prior Cardiac Surgery, Arrythmias:LBBB and Atrial Fibrillation, Signs/Symptoms:Dyspnea, Shortness of Breath, Dizziness/Lightheadedness and Fatigue; Risk Factors:Sleep Apnea, Hypertension, Diabetes, Dyslipidemia, Former Smoker and Family History of Coronary Artery Disease. Prior EF 35-40%, Post Operative Atrial Fibrillation (2020) s/p CABG.  Sonographer:    Heather Hawks RDCS Referring Phys: [facility] A Christopher Nolan  IMPRESSIONS   1. Left ventricular ejection fraction, by estimation, is 65 to 70%. The left ventricle has normal function. The left ventricle has no regional wall motion abnormalities. There is moderate asymmetric left ventricular hypertrophy of the basal-septal segment. Left ventricular diastolic parameters are indeterminate. 2. No hemodynamically significant outflow tract gradient with or without valsalva. 3. Right ventricular  systolic function is normal. The right ventricular size is normal. Tricuspid regurgitation signal is inadequate for assessing PA pressure. 4. Left atrial size was mildly dilated. 5. Right atrial size was moderately dilated. 6. The mitral valve is normal in structure. Mild mitral valve regurgitation. No evidence of mitral stenosis. 7. The aortic valve is tricuspid. Aortic valve regurgitation is not visualized. Aortic valve  sclerosis/calcification is present, without any evidence of aortic stenosis. 8. Aortic dilatation noted. There is mild dilatation of the aortic root, measuring 38 mm. There is borderline dilatation of the ascending aorta, measuring 37 mm.  FINDINGS Left Ventricle: Left ventricular ejection fraction, by estimation, is 65 to 70%. The left ventricle has normal function. The left ventricle has no regional wall motion abnormalities. The left ventricular internal cavity size was normal in size. There is moderate asymmetric left ventricular hypertrophy of the basal-septal segment. Left ventricular diastolic parameters are indeterminate.  Right Ventricle: The right ventricular size is normal. Right vetricular wall thickness was not well visualized. Right ventricular systolic function is normal. Tricuspid regurgitation signal is inadequate for assessing PA pressure.  Left Atrium: Left atrial size was mildly dilated.  Right Atrium: Right atrial size was moderately dilated.  Pericardium: There is no evidence of pericardial effusion.  Mitral Valve: The mitral valve is normal in structure. Mild mitral valve regurgitation. No evidence of mitral valve stenosis.  Tricuspid Valve: The tricuspid valve is normal in structure. Tricuspid valve regurgitation is not demonstrated. No evidence of tricuspid stenosis.  Aortic Valve: The aortic valve is tricuspid. Aortic valve regurgitation is not visualized. Aortic valve sclerosis/calcification is present, without any evidence of aortic stenosis. Aortic valve mean gradient measures 6.0 mmHg. Aortic valve peak gradient measures 11.6 mmHg. Aortic valve area, by VTI measures 4.30 cm.  Pulmonic Valve: The pulmonic valve was normal in structure. Pulmonic valve regurgitation is not visualized. No evidence of pulmonic stenosis.  Aorta: Aortic dilatation noted. There is mild dilatation of the aortic root, measuring 38 mm. There is borderline dilatation of the ascending aorta,  measuring 37 mm.  Venous: The inferior vena cava was not well visualized.  IAS/Shunts: No atrial level shunt detected by color flow Doppler.  Additional Comments: No hemodynamically significant outflow tract gradient with or without valsalva.   LEFT VENTRICLE PLAX 2D LVIDd:         1.70 cm   Diastology LVIDs:         0.80 cm   LV e' medial:    6.14 cm/s LV PW:         0.90 cm   LV E/e' medial:  11.6 LV IVS:        1.70 cm   LV e' lateral:   11.75 cm/s LVOT diam:     2.50 cm   LV E/e' lateral: 6.1 LV SV:         160 LV SV Index:   82 LVOT Area:     4.91 cm LV IVRT:       134 msec   RIGHT VENTRICLE             IVC RV Basal diam:  3.70 cm     IVC diam: 1.80 cm RV S prime:     16.50 cm/s TAPSE (M-mode): 2.1 cm      PULMONARY VEINS Diastolic Velocity: 46.90 cm/s S/D Velocity:       1.40 Systolic Velocity:  64.10 cm/s  LEFT ATRIUM             Index  RIGHT ATRIUM           Index LA diam:        3.10 cm 1.59 cm/m   RA Pressure: 3.00 mmHg LA Vol (A2C):   52.9 ml 27.11 ml/m  RA Area:     22.80 cm LA Vol (A4C):   51.6 ml 26.45 ml/m  RA Volume:   75.10 ml  38.49 ml/m LA Biplane Vol: 54.7 ml 28.04 ml/m AORTIC VALVE AV Area (Vmax):    4.43 cm AV Area (Vmean):   4.44 cm AV Area (VTI):     4.30 cm AV Vmax:           170.00 cm/s AV Vmean:          115.500 cm/s AV VTI:            0.372 m AV Peak Grad:      11.6 mmHg AV Mean Grad:      6.0 mmHg LVOT Vmax:         153.50 cm/s LVOT Vmean:        104.575 cm/s LVOT VTI:          0.326 m LVOT/AV VTI ratio: 0.88  AORTA Ao Root diam: 3.80 cm Ao Asc diam:  3.70 cm  MITRAL VALVE                  TRICUSPID VALVE MV Area (PHT): cm            Estimated RAP:  3.00 mmHg MV Decel Time: 263 msec MR Peak grad:    125.7 mmHg   SHUNTS MR Mean grad:    84.5 mmHg    Systemic VTI:  0.33 m MR Vmax:         560.50 cm/s  Systemic Diam: 2.50 cm MR Vmean:        441.5 cm/s MR PISA:         3.53 cm MR PISA Eff ROA: 20 mm MR PISA  Radius:  0.75 cm MV E velocity: 71.55 cm/s MV A velocity: 81.25 cm/s MV E/A ratio:  0.88  Emeline Calender Electronically signed by Emeline Calender Signature Date/Time: 08/08/2024/2:22:25 PM    Final          ______________________________________________________________________________________________       ROS: As per HPI.  Physical Exam:    VS:  BP 133/88 (BP Location: Right Arm)   Pulse 71   Ht 5' 11 (1.803 m)   Wt 163 lb (73.9 kg)   SpO2 97%   BMI 22.73 kg/m    Wt Readings from Last 3 Encounters:  10/04/24 163 lb (73.9 kg)  07/05/24 166 lb 9.6 oz (75.6 kg)  03/21/24 176 lb 2 oz (79.9 kg)    Gen: No distress  HEENT: No JVD,  Poor dentition Ears: Bilateral Dempsey Sign Cardiac: No Rubs or Gallops, Systolic crescendo Murmur, RRR +2 radial pulses Respiratory: Clear to auscultation bilaterally, normal effort, normal  respiratory rate GI: Soft, nontender, non-distended  MS: No edema;  moves all extremities Integument: Skin feels warm Neuro:  At time of evaluation, alert and oriented to person/place/time/situation  Psych: Normal affect, patient feels well   ASSESSMENT AND PLAN: .    Aortic sclerosis and mild mitral regurgitation Echocardiogram shows aortic sclerosis without aortic stenosis and mild mitral regurgitation. Aortic valve opens well despite being slightly thicker, likely age-related. No current symptoms warranting intervention. - Continue to monitor aortic valve and mitral regurgitation every few years. - Will repeat echocardiogram in 2028  if asymptomatic.  Hypertension with T2DM - Blood pressure is well controlled with current management by Dr. Loring. - Blood sugar levels have been slightly elevated but are being managed.  Hyperlipidemia - Cholesterol levels are being managed by Dr. Loring. - I have offered his CAC scoring in the setting of his prior syncopal episode and his risk factors for CAD.  He has had no further symptoms.  He has deferred testing  at this time; we have given him information about this testing; I have a very low threshold for ischemic testing for him  History of near syncope No further episodes of near syncope since the last event. Previous evaluation included a CT scan of the head with no significant findings. No current symptoms warranting further investigation. - If another episode of near syncope occurs, will perform a CT scan to assess for blood flow issues or ischemia. - If chest pain develops, will consider a heart monitor and a CT scan with contrast to evaluate coronary arteries.  Longitudinal care: The evaluation and management services provided today reflect the complexity inherent in caring for this patient, including the ongoing longitudinal relationship and management of multiple chronic conditions and/or the need for care coordination. The visit required a comprehensive assessment and management plan tailored to the patient's unique needs Time was spent addressing not only the acute concerns but also the broader context of the patient's health, including preventive care, chronic disease management, and care coordination as appropriate.  Complex longitudinal is necessary for conditions including: Would like to perform more aggressive testing; he has preferred a more minimalist approach; SDM for primary prevention  Christopher Leavens, MD FASE [facility] Cardiologist [facility]  [address] [place], [place] [address] [phone]  9:32 AM  "

## 2024-10-04 NOTE — Patient Instructions (Signed)
 Medication Instructions:  Your physician recommends that you continue on your current medications as directed. Please refer to the Current Medication list given to you today.  *If you need a refill on your cardiac medications before your next appointment, please call your pharmacy*   Testing/Procedures: Your physician has recommended that you have a coronary calcium score performed. This is not covered by insurance and will be an out-of-pocket cost of approximately $99. Please contact our office if you would like to schedule this.   Follow-Up: At [facility], you and your health needs are our priority.  As part of our continuing mission to provide you with exceptional heart care, our providers are all part of one team.  This team includes your primary Cardiologist (physician) and Advanced Practice Providers or APPs (Physician Assistants and Nurse Practitioners) who all work together to provide you with the care you need, when you need it.  Your next appointment:   1 year(s)  Provider:   With APP or Dr. Santo

## 2024-11-02 ENCOUNTER — Ambulatory Visit: Payer: Medicare (Managed Care) | Admitting: Family Medicine

## 2024-11-02 ENCOUNTER — Encounter: Payer: Self-pay | Admitting: Family Medicine

## 2024-11-02 VITALS — BP 132/88 | HR 83 | Ht 71.0 in | Wt 157.4 lb

## 2024-11-02 DIAGNOSIS — E1159 Type 2 diabetes mellitus with other circulatory complications: Secondary | ICD-10-CM | POA: Diagnosis not present

## 2024-11-02 DIAGNOSIS — I152 Hypertension secondary to endocrine disorders: Secondary | ICD-10-CM | POA: Diagnosis not present

## 2024-11-02 DIAGNOSIS — I358 Other nonrheumatic aortic valve disorders: Secondary | ICD-10-CM | POA: Diagnosis not present

## 2024-11-02 DIAGNOSIS — Z7689 Persons encountering health services in other specified circumstances: Secondary | ICD-10-CM | POA: Diagnosis not present

## 2024-11-02 DIAGNOSIS — Z720 Tobacco use: Secondary | ICD-10-CM | POA: Diagnosis not present

## 2024-11-02 DIAGNOSIS — E119 Type 2 diabetes mellitus without complications: Secondary | ICD-10-CM | POA: Diagnosis not present

## 2024-11-02 DIAGNOSIS — E785 Hyperlipidemia, unspecified: Secondary | ICD-10-CM | POA: Diagnosis not present

## 2024-11-02 DIAGNOSIS — E1169 Type 2 diabetes mellitus with other specified complication: Secondary | ICD-10-CM

## 2024-11-02 MED ORDER — TRIAMTERENE-HCTZ 37.5-25 MG PO TABS: 1.0000 | ORAL_TABLET | Freq: Every day | ORAL | 3 refills | Status: AC

## 2024-11-02 NOTE — Patient Instructions (Signed)
" °  YOUR PLAN: TYPE 2 DIABETES MELLITUS: You have type 2 diabetes and are currently taking metformin. -We ordered labs to check your blood sugar levels. -We scheduled a follow-up appointment in three months to review your lab results and adjust treatment if necessary.  HYPERTENSION: Your high blood pressure is managed with amlodipine  and triamterene  hydrochlorothiazide . -We refilled your triamterene  hydrochlorothiazide  prescription.  HYPERLIPIDEMIA: Your high cholesterol is managed with rosuvastatin. -Continue taking rosuvastatin as prescribed.  VALVULAR HEART DISEASE (MILD AORTIC SCLEROSIS AND MILD MITRAL REGURGITATION): You have mild aortic sclerosis and mild mitral regurgitation, which do not require intervention at this time. -No significant findings requiring intervention at this time.  "

## 2024-11-02 NOTE — Assessment & Plan Note (Signed)
 Taking 500mg  metformin daily - Ordered labs to check blood sugar levels. - Scheduled follow-up in three months to review lab results and adjust treatment if necessary.

## 2024-11-02 NOTE — Progress Notes (Unsigned)
 "   Subjective   Patient ID: Christopher Nolan, male    DOB: Feb 26, 1956  Age: 69 y.o. MRN: [id number]  Chief Complaint  Patient presents with   New Patient (Initial Visit)     History of Present Illness   Christopher Nolan is a 69 year old male who presents for a routine follow-up visit.  He takes amlodipine  10 mg, triamterene  hydrochlorothiazide , rosuvastatin 20 mg, and metformin once daily, as well as a multivitamin and vitamin D, with good adherence and no recent medication changes.  He had routine blood work about three to four months ago, around October or November, but is unsure of the exact date.  He has mild aortic sclerosis and mild mitral regurgitation diagnosed by cardiology in December.  His family history is significant for diabetes in his mother, sister, and brother, and stomach cancer on his maternal side.  He uses smokeless tobacco long term. He drinks alcohol very rarely, about twice in the past year.          The ASCVD Risk score (Arnett DK, et al., 2019) failed to calculate for the following reasons:   Cannot find a previous HDL lab   Cannot find a previous total cholesterol lab   * - Cholesterol units were assumed  Health Maintenance Due  Topic Date Due   Medicare Annual Wellness (AWV)  Never done   FOOT EXAM  Never done   OPHTHALMOLOGY EXAM  Never done   Diabetic kidney evaluation - Urine ACR  Never done   Hepatitis C Screening  Never done   DTaP/Tdap/Td (1 - Tdap) Never done   Colonoscopy  Never done   HEMOGLOBIN A1C  11/18/2012   Diabetic kidney evaluation - eGFR measurement  03/19/2023   COVID-19 Vaccine (1 - 2025-26 season) Never done      Objective:     BP 132/88   Pulse 83   Ht 5' 11 (1.803 m)   Wt 157 lb 6.4 oz (71.4 kg)   SpO2 98%   BMI 21.95 kg/m  {Vitals History (Optional):23777}  Physical Exam   Gen: alert, oriented HEENT: perrla, eomi, mmm CV: rrr, no murmur Pulm: lctab. No wheeze or crackles.  GI: soft, nbs.  Nontender to  palpation MSK: strength equal b/l. Normal gait Ext: no pedal edema Skin: warm and dry, no rashes Psych: pleasant affect.  Spontaneous speech       No results found for any visits on 11/02/24.      Assessment & Plan:   Encounter to establish care  Type 2 diabetes mellitus without complication, without long-term current use of insulin (HCC) Assessment & Plan: Taking 500mg  metformin daily - Ordered labs to check blood sugar levels. - Scheduled follow-up in three months to review lab results and adjust treatment if necessary.  Orders: -     Comprehensive metabolic panel with GFR; Future -     Lipid panel; Future -     Hemoglobin A1c; Future -     Microalbumin / creatinine urine ratio; Future  Hypertension associated with diabetes (HCC) Assessment & Plan: Managed with amlodipine  and triamterene  hydrochlorothiazide . - Refilled triamterene  hydrochlorothiazide  prescription.  Orders: -     Comprehensive metabolic panel with GFR; Future -     Microalbumin / creatinine urine ratio; Future  Hyperlipidemia associated with type 2 diabetes mellitus (HCC) Assessment & Plan: Managed with rosuvastatin. Continue current tx.  Getting lipid level.   Orders: -     Comprehensive metabolic panel with GFR; Future -  Lipid panel; Future  Aortic valve sclerosis Assessment & Plan: Mild aortic sclerosis and mild mitral regurgitation noted on echocardiogram. No significant findings requiring intervention.   Smokeless tobacco use Assessment & Plan: Uses dip tobacco for many years.  No ulcerations seen on oral exam   Other orders -     Triamterene -HCTZ; Take 1 tablet by mouth daily.  Dispense: 90 tablet; Refill: 3              Return in about 3 months (around 01/31/2025) for DM.    Toribio MARLA Slain, MD  "

## 2024-11-02 NOTE — Assessment & Plan Note (Signed)
 Managed with rosuvastatin. Continue current tx.  Getting lipid level.

## 2024-11-02 NOTE — Assessment & Plan Note (Signed)
 Managed with amlodipine  and triamterene  hydrochlorothiazide . - Refilled triamterene  hydrochlorothiazide  prescription.

## 2024-11-02 NOTE — Assessment & Plan Note (Signed)
 Mild aortic sclerosis and mild mitral regurgitation noted on echocardiogram. No significant findings requiring intervention.

## 2024-11-02 NOTE — Assessment & Plan Note (Signed)
 Uses dip tobacco for many years.  No ulcerations seen on oral exam

## 2024-11-08 ENCOUNTER — Other Ambulatory Visit: Payer: Medicare (Managed Care)

## 2024-11-08 DIAGNOSIS — E1159 Type 2 diabetes mellitus with other circulatory complications: Secondary | ICD-10-CM

## 2024-11-08 DIAGNOSIS — E119 Type 2 diabetes mellitus without complications: Secondary | ICD-10-CM

## 2024-11-08 DIAGNOSIS — E1169 Type 2 diabetes mellitus with other specified complication: Secondary | ICD-10-CM

## 2024-11-09 LAB — COMPREHENSIVE METABOLIC PANEL WITH GFR
ALT: 16 [IU]/L (ref 0–44)
AST: 22 [IU]/L (ref 0–40)
Albumin: 4.4 g/dL (ref 3.9–4.9)
Alkaline Phosphatase: 70 [IU]/L (ref 47–123)
BUN/Creatinine Ratio: 13 (ref 10–24)
BUN: 20 mg/dL (ref 8–27)
Bilirubin Total: 0.6 mg/dL (ref 0.0–1.2)
CO2: 29 mmol/L (ref 20–29)
Calcium: 9.7 mg/dL (ref 8.6–10.2)
Chloride: 92 mmol/L — ABNORMAL LOW (ref 96–106)
Creatinine, Ser: 1.51 mg/dL — ABNORMAL HIGH (ref 0.76–1.27)
Globulin, Total: 2.6 g/dL (ref 1.5–4.5)
Glucose: 99 mg/dL (ref 70–99)
Potassium: 3.7 mmol/L (ref 3.5–5.2)
Sodium: 139 mmol/L (ref 134–144)
Total Protein: 7 g/dL (ref 6.0–8.5)
eGFR: 50 mL/min/{1.73_m2} — ABNORMAL LOW

## 2024-11-09 LAB — MICROALBUMIN / CREATININE URINE RATIO
Creatinine, Urine: 149.1 mg/dL
Microalb/Creat Ratio: 51 mg/g{creat} — ABNORMAL HIGH (ref 0–29)
Microalbumin, Urine: 76.1 ug/mL

## 2024-11-09 LAB — LIPID PANEL
Chol/HDL Ratio: 1.9 ratio (ref 0.0–5.0)
Cholesterol, Total: 106 mg/dL (ref 100–199)
HDL: 57 mg/dL
LDL Chol Calc (NIH): 37 mg/dL (ref 0–99)
Triglycerides: 49 mg/dL (ref 0–149)
VLDL Cholesterol Cal: 12 mg/dL (ref 5–40)

## 2024-11-09 LAB — HEMOGLOBIN A1C
Est. average glucose Bld gHb Est-mCnc: 126 mg/dL
Hgb A1c MFr Bld: 6 % — ABNORMAL HIGH (ref 4.8–5.6)

## 2024-11-10 ENCOUNTER — Ambulatory Visit: Payer: Self-pay | Admitting: Family Medicine

## 2025-02-01 ENCOUNTER — Ambulatory Visit: Payer: Medicare (Managed Care) | Admitting: Family Medicine
